# Patient Record
Sex: Female | Born: 1975 | Race: White | Hispanic: No | Marital: Married | Smoking: Never smoker
Health system: Southern US, Community
[De-identification: ages and names within clinical notes are randomized; demographics above are authoritative.]

## PROBLEM LIST (undated history)

## (undated) DIAGNOSIS — Z8041 Family history of malignant neoplasm of ovary: Secondary | ICD-10-CM

## (undated) DIAGNOSIS — K5190 Ulcerative colitis, unspecified, without complications: Secondary | ICD-10-CM

## (undated) DIAGNOSIS — A6920 Lyme disease, unspecified: Secondary | ICD-10-CM

## (undated) HISTORY — DX: Lyme disease, unspecified: A69.20

## (undated) HISTORY — DX: Family history of malignant neoplasm of ovary: Z80.41

---

## 1997-01-22 HISTORY — PX: BREAST CYST ASPIRATION: SHX578

## 2006-08-27 ENCOUNTER — Ambulatory Visit: Payer: Self-pay | Admitting: Internal Medicine

## 2008-05-28 ENCOUNTER — Ambulatory Visit: Payer: Self-pay | Admitting: Otolaryngology

## 2009-09-22 ENCOUNTER — Ambulatory Visit: Payer: Self-pay | Admitting: Obstetrics & Gynecology

## 2011-12-24 ENCOUNTER — Ambulatory Visit: Payer: Self-pay | Admitting: Unknown Physician Specialty

## 2014-03-03 ENCOUNTER — Ambulatory Visit: Payer: Self-pay | Admitting: Physician Assistant

## 2014-05-03 ENCOUNTER — Ambulatory Visit
Admit: 2014-05-03 | Disposition: A | Discharge status: Home / Self Care | Payer: Self-pay | Attending: Family Medicine | Admitting: Family Medicine

## 2014-05-03 LAB — RAPID STREP-A WITH REFLX: Micro Text Report: POSITIVE

## 2015-01-31 ENCOUNTER — Other Ambulatory Visit: Payer: Self-pay | Admitting: Obstetrics & Gynecology

## 2015-01-31 DIAGNOSIS — Z1239 Encounter for other screening for malignant neoplasm of breast: Secondary | ICD-10-CM

## 2015-02-02 ENCOUNTER — Inpatient Hospital Stay
Admission: RE | Admit: 2015-02-02 | Discharge: 2015-02-02 | Disposition: A | Discharge status: Home / Self Care | Payer: Self-pay | Source: Ambulatory Visit | Attending: *Deleted | Admitting: *Deleted

## 2015-02-02 ENCOUNTER — Other Ambulatory Visit: Payer: Self-pay | Admitting: *Deleted

## 2015-02-02 DIAGNOSIS — Z9289 Personal history of other medical treatment: Secondary | ICD-10-CM

## 2015-02-11 ENCOUNTER — Ambulatory Visit
Admission: RE | Admit: 2015-02-11 | Discharge: 2015-02-11 | Disposition: A | Discharge status: Home / Self Care | Payer: BC Managed Care – PPO | Source: Ambulatory Visit | Attending: Obstetrics & Gynecology | Admitting: Obstetrics & Gynecology

## 2015-02-11 ENCOUNTER — Other Ambulatory Visit: Payer: Self-pay | Admitting: Obstetrics & Gynecology

## 2015-02-11 DIAGNOSIS — Z1239 Encounter for other screening for malignant neoplasm of breast: Secondary | ICD-10-CM

## 2015-02-11 DIAGNOSIS — Z1231 Encounter for screening mammogram for malignant neoplasm of breast: Secondary | ICD-10-CM | POA: Insufficient documentation

## 2016-01-09 ENCOUNTER — Ambulatory Visit
Admission: EM | Admit: 2016-01-09 | Discharge: 2016-01-09 | Disposition: A | Discharge status: Home / Self Care | Payer: BC Managed Care – PPO | Attending: Family Medicine | Admitting: Family Medicine

## 2016-01-09 DIAGNOSIS — R6889 Other general symptoms and signs: Secondary | ICD-10-CM | POA: Diagnosis not present

## 2016-01-09 HISTORY — DX: Ulcerative colitis, unspecified, without complications: K51.90

## 2016-01-09 NOTE — Discharge Instructions (Signed)
Rest. Drink plenty of fluids.  ° °Follow up with your primary care physician this week as needed. Return to Urgent care for new or worsening concerns.  ° °

## 2016-01-09 NOTE — ED Triage Notes (Signed)
Patient complains of sinus pain and pressure, headaches. Patient states that she has nasal congestions and coughing. Patient states that symptom started 6 days ago.

## 2016-01-09 NOTE — ED Provider Notes (Signed)
[facility] ____________________________________________  Time seen: Approximately 6:21 PM  I have reviewed the triage vital signs and the nursing notes.   HISTORY  Chief Complaint Facial Pain  HPI  Janet Poole is a 40 y.o. female present for the complaints of 3 days of runny nose, nasal congestion, sinus pressure, body aches and chills. Denies known fevers, but reports has felt like she had a fever at home. States symptoms were sudden onset. Reports multiple sick contacts at work. Patient reports some improvement with over-the-counter congestion medications. Reports overall continues to eat and drink well. Denies sore throat.  Denies recent sickness or recent antibiotic use. Denies chest pain, shortness of breath, abdominal pain, dysuria, extremity pain or extremity swelling.  No LMP recorded. Patient is not currently having periods (Reason: IUD). Denies chance of pregnancy.  Mickey FarberHIES, DAVID, MD: PCP    Past Medical History:  Diagnosis Date  . Ulcerative colitis (HCC)     There are no active problems to display for this patient.   Past Surgical History:  Procedure Laterality Date  . BREAST CYST ASPIRATION  1999    Current Outpatient Rx  . Order #: [id number] Class: Historical Med  . Order #: [id number] Class: Historical Med    No current facility-administered medications for this encounter.   Current Outpatient Prescriptions:  .  mesalamine (LIALDA) 1.2 g EC tablet, Take by mouth daily with breakfast., Disp: , Rfl:  .  PARoxetine (PAXIL) 30 MG tablet, Take 30 mg by mouth daily., Disp: , Rfl:   Allergies Sulfa antibiotics  Family History  Problem Relation Age of Onset  . Breast cancer Paternal Grandmother     Social History Social History  Substance Use Topics  . Smoking status: Never Smoker  . Smokeless tobacco: Never Used  . Alcohol use Yes     Comment: occasionally    Review of Systems Constitutional: As above.   Eyes: No visual  changes. ENT: As above.  Cardiovascular: Denies chest pain. Respiratory: Denies shortness of breath. Gastrointestinal: No abdominal pain.  No nausea, no vomiting.  No diarrhea.  No constipation. Genitourinary: Negative for dysuria. Musculoskeletal: Negative for back pain. Skin: Negative for rash. Neurological: Negative for headaches, focal weakness or numbness.  10-point ROS otherwise negative.  ____________________________________________   PHYSICAL EXAM:  VITAL SIGNS: ED Triage Vitals  Enc Vitals Group     BP 01/09/16 1715 137/86     Pulse Rate 01/09/16 1715 75     Resp 01/09/16 1715 17     Temp 01/09/16 1715 98.4 F (36.9 C)     Temp Source 01/09/16 1715 Oral     SpO2 01/09/16 1715 100 %     Weight 01/09/16 1712 125 lb (56.7 kg)     Height 01/09/16 1712 5' (1.524 m)     Head Circumference --      Peak Flow --      Pain Score 01/09/16 1714 3     Pain Loc --      Pain Edu? --      Excl. in GC? --     Constitutional: Alert and oriented. Well appearing and in no acute distress. Eyes: Conjunctivae are normal. PERRL. EOMI. Head: Atraumatic. No sinus tenderness to palpation. No swelling. No erythema.    Ears: no erythema, normal TMs bilaterally.   Nose: Nasal congestion with clear rhinorrhea.  Mouth/Throat: Mucous membranes are moist. No pharyngeal erythema. No tonsillar swelling or exudate.  Neck: No stridor.  No cervical spine tenderness to palpation. Hematological/Lymphatic/Immunilogical:  No cervical lymphadenopathy. Cardiovascular: Normal rate, regular rhythm. Grossly normal heart sounds.  Good peripheral circulation. Respiratory: Normal respiratory effort.  No retractions. Lungs CTAB. No wheezes, rales or rhonchi. Good air movement.  Gastrointestinal: Soft and nontender. No CVA tenderness. Musculoskeletal: Ambulatory with steady gait. No cervical, thoracic or lumbar tenderness to palpation. Neurologic:  Normal speech and language.  No gait instability. Skin:  Skin  is warm, dry and intact. No rash noted. Psychiatric: Mood and affect are normal. Speech and behavior are normal.  ___________________________________________   LABS (all labs ordered are listed, but only abnormal results are displayed)  Labs Reviewed - No data to display ____________________________________________  PROCEDURES Procedures    INITIAL IMPRESSION / ASSESSMENT AND PLAN / ED COURSE  Pertinent labs & imaging results that were available during my care of the patient were reviewed by me and considered in my medical decision making (see chart for details).  Well-appearing patient. No acute distress. Lungs clear throughout. Suspect influenza-like illness. Discussed evaluation by influenza swab, and patient declines at this time. Declines strep swab, and states no sore throat. Patient states that she has ulcerative colitis and frequently worried about taking other medications, and declines Tamiflu prescription. Patient states that she will take over-the-counter cough and congestion medications that she knows she tolerates well. Work note given for today and tomorrow. Encouraged supportive care, rest, fluids and PCP follow-up as needed.   Discussed follow up with Primary care physician this week. Discussed follow up and return parameters including no resolution or any worsening concerns. Patient verbalized understanding and agreed to plan.   ____________________________________________   FINAL CLINICAL IMPRESSION(S) / ED DIAGNOSES  Final diagnoses:  Flu-like symptoms     Discharge Medication List as of 01/09/2016  6:37 PM      Note: This dictation was prepared with Dragon dictation along with smaller phrase technology. Any transcriptional errors that result from this process are unintentional.    Clinical Course       Renford DillsLindsey Hiran Leard, NP 01/10/16 1927    Renford DillsLindsey Shamarcus Hoheisel, NP 01/10/16 1929

## 2016-02-17 ENCOUNTER — Other Ambulatory Visit: Payer: Self-pay | Admitting: Obstetrics & Gynecology

## 2016-02-17 DIAGNOSIS — Z1231 Encounter for screening mammogram for malignant neoplasm of breast: Secondary | ICD-10-CM

## 2016-03-21 ENCOUNTER — Ambulatory Visit
Admission: RE | Admit: 2016-03-21 | Discharge: 2016-03-21 | Disposition: A | Discharge status: Home / Self Care | Payer: BC Managed Care – PPO | Source: Ambulatory Visit | Attending: Obstetrics & Gynecology | Admitting: Obstetrics & Gynecology

## 2016-03-21 DIAGNOSIS — Z1231 Encounter for screening mammogram for malignant neoplasm of breast: Secondary | ICD-10-CM | POA: Diagnosis not present

## 2016-03-22 ENCOUNTER — Encounter: Payer: Self-pay | Admitting: Obstetrics & Gynecology

## 2017-02-18 ENCOUNTER — Encounter: Payer: Self-pay | Admitting: Obstetrics & Gynecology

## 2017-02-18 ENCOUNTER — Ambulatory Visit (INDEPENDENT_AMBULATORY_CARE_PROVIDER_SITE_OTHER): Payer: BC Managed Care – PPO | Admitting: Obstetrics & Gynecology

## 2017-02-18 VITALS — BP 120/82 | HR 75 | Ht 60.0 in | Wt 125.0 lb

## 2017-02-18 DIAGNOSIS — Z0000 Encounter for general adult medical examination without abnormal findings: Secondary | ICD-10-CM

## 2017-02-18 DIAGNOSIS — Z1231 Encounter for screening mammogram for malignant neoplasm of breast: Secondary | ICD-10-CM

## 2017-02-18 DIAGNOSIS — Z1239 Encounter for other screening for malignant neoplasm of breast: Secondary | ICD-10-CM

## 2017-02-18 DIAGNOSIS — Z01419 Encounter for gynecological examination (general) (routine) without abnormal findings: Secondary | ICD-10-CM | POA: Diagnosis not present

## 2017-02-18 NOTE — Patient Instructions (Signed)
PAP every three years Mammogram every year    Call [phone] to schedule at [facility] every 5 years

## 2017-02-18 NOTE — Progress Notes (Signed)
HPI:      [name] is a 42 y.o. [id number] who LMP was Patient's last menstrual period was 01/18/2017., she presents today for her annual examination. The patient has no complaints today. The patient is sexually active. Her last pap: approximate date 2017 and was normal. The patient does perform self breast exams.  There is no notable family history of breast or ovarian cancer in her family.  The patient has regular exercise: yes.  The patient denies current symptoms of depression.    GYN History: Contraception: vasectomy  PMHx: Past Medical History:  Diagnosis Date  . Lyme disease   . Ulcerative colitis [facility](HCC)    Past Surgical History:  Procedure Laterality Date  . BREAST CYST ASPIRATION  1999   Family History  Problem Relation Age of Onset  . Breast cancer Paternal Grandmother   . Ovarian cancer Maternal Grandmother   . Hypertension Mother   . Hypertension Father   . Stroke Father   . Pancreatic cancer Maternal Aunt    Social History   Tobacco Use  . Smoking status: Never Smoker  . Smokeless tobacco: Never Used  Substance Use Topics  . Alcohol use: Yes    Comment: occasionally  . Drug use: No    Current Outpatient Medications:  .  mesalamine (LIALDA) 1.2 g EC tablet, Take by mouth daily with breakfast., Disp: , Rfl:  .  PARoxetine (PAXIL) 30 MG tablet, Take 30 mg by mouth daily., Disp: , Rfl:  Allergies: Cefdinir; Metronidazole; Sulfa antibiotics; and Sulfasalazine  Review of Systems  Constitutional: Negative for chills, fever and malaise/fatigue.  HENT: Negative for congestion, sinus pain and sore throat.   Eyes: Negative for blurred vision and pain.  Respiratory: Negative for cough and wheezing.   Cardiovascular: Negative for chest pain and leg swelling.  Gastrointestinal: Negative for abdominal pain, constipation, diarrhea, heartburn, nausea and vomiting.  Genitourinary: Negative for dysuria, frequency, hematuria and urgency.  Musculoskeletal:  Negative for back pain, joint pain, myalgias and neck pain.  Skin: Negative for itching and rash.  Neurological: Negative for dizziness, tremors and weakness.  Endo/Heme/Allergies: Does not bruise/bleed easily.  Psychiatric/Behavioral: Negative for depression. The patient is not nervous/anxious and does not have insomnia.     Objective: BP 120/82   Pulse 75   Ht 5' (1.524 m)   Wt 125 lb (56.7 kg)   LMP 01/18/2017   BMI 24.41 kg/m   Filed Weights   02/18/17 1528  Weight: 125 lb (56.7 kg)   Body mass index is 24.41 kg/m. Physical Exam  Constitutional: She is oriented to person, place, and time. She appears well-developed and well-nourished. No distress.  Genitourinary: Rectum normal, vagina normal and uterus normal. Pelvic exam was performed with patient supine. There is no rash or lesion on the right labia. There is no rash or lesion on the left labia. Vagina exhibits no lesion. No bleeding in the vagina. Right adnexum does not display mass and does not display tenderness. Left adnexum does not display mass and does not display tenderness. Cervix does not exhibit motion tenderness, lesion, friability or polyp.   Uterus is mobile and midaxial. Uterus is not enlarged or exhibiting a mass.  HENT:  Head: Normocephalic and atraumatic. Head is without laceration.  Right Ear: Hearing normal.  Left Ear: Hearing normal.  Nose: No epistaxis.  No foreign bodies.  Mouth/Throat: Uvula is midline, oropharynx is clear and moist and mucous membranes are normal.  Eyes: Pupils are equal, round, and  reactive to light.  Neck: Normal range of motion. Neck supple. No thyromegaly present.  Cardiovascular: Normal rate and regular rhythm. Exam reveals no gallop and no friction rub.  No murmur heard. Pulmonary/Chest: Effort normal and breath sounds normal. No respiratory distress. She has no wheezes. Right breast exhibits no mass, no skin change and no tenderness. Left breast exhibits no mass, no skin change  and no tenderness.  Abdominal: Soft. Bowel sounds are normal. She exhibits no distension. There is no tenderness. There is no rebound.  Musculoskeletal: Normal range of motion.  Neurological: She is alert and oriented to person, place, and time. No cranial nerve deficit.  Skin: Skin is warm and dry.  Psychiatric: She has a normal mood and affect. Judgment normal.  Vitals reviewed.  Assessment:  ANNUAL EXAM 1. Annual physical exam   2. Screening for breast cancer    Screening Plan:            1.  Cervical Screening-  Pap smear schedule reviewed with patient  2. Breast screening- Exam annually and mammogram>40 planned   3. Colonoscopy every 10 years, Hemoccult testing - after age 40  4. Labs managed by PCP  5. Counseling for contraception: vasectomy  Uses MIRENA IUD for cycle and mood control, due for exchange this year    F/U  Return in about 5 months (around 07/19/2017) for IUD Appointment (MIRENA), and Annual when due.  Janet Major, MD, [facility], [facility] 02/18/2017  4:06 PM

## 2017-02-19 ENCOUNTER — Encounter: Payer: Self-pay | Admitting: Obstetrics and Gynecology

## 2017-03-25 ENCOUNTER — Ambulatory Visit
Admission: RE | Admit: 2017-03-25 | Discharge: 2017-03-25 | Disposition: A | Discharge status: Home / Self Care | Payer: BC Managed Care – PPO | Source: Ambulatory Visit | Attending: Obstetrics & Gynecology | Admitting: Obstetrics & Gynecology

## 2017-03-25 DIAGNOSIS — Z1231 Encounter for screening mammogram for malignant neoplasm of breast: Secondary | ICD-10-CM | POA: Insufficient documentation

## 2017-03-25 DIAGNOSIS — Z1239 Encounter for other screening for malignant neoplasm of breast: Secondary | ICD-10-CM

## 2017-03-26 ENCOUNTER — Encounter: Payer: Self-pay | Admitting: Obstetrics & Gynecology

## 2018-02-07 ENCOUNTER — Other Ambulatory Visit: Payer: Self-pay | Admitting: Otolaryngology

## 2018-02-07 DIAGNOSIS — R490 Dysphonia: Secondary | ICD-10-CM

## 2018-02-11 ENCOUNTER — Ambulatory Visit
Admission: RE | Admit: 2018-02-11 | Discharge: 2018-02-11 | Disposition: A | Discharge status: Home / Self Care | Payer: BC Managed Care – PPO | Source: Ambulatory Visit | Attending: Otolaryngology | Admitting: Otolaryngology

## 2018-02-11 ENCOUNTER — Encounter (INDEPENDENT_AMBULATORY_CARE_PROVIDER_SITE_OTHER): Payer: Self-pay

## 2018-02-11 DIAGNOSIS — R490 Dysphonia: Secondary | ICD-10-CM | POA: Diagnosis not present

## 2018-02-11 MED ORDER — IOHEXOL 300 MG/ML  SOLN
75.0000 mL | Freq: Once | INTRAMUSCULAR | Status: AC | PRN
  Administered 2018-02-11: 75 mL via INTRAVENOUS

## 2018-02-17 ENCOUNTER — Ambulatory Visit: Payer: BC Managed Care – PPO | Attending: Otolaryngology | Admitting: Speech Pathology

## 2018-02-17 DIAGNOSIS — R490 Dysphonia: Secondary | ICD-10-CM | POA: Diagnosis not present

## 2018-02-18 ENCOUNTER — Other Ambulatory Visit: Payer: Self-pay

## 2018-02-18 ENCOUNTER — Encounter: Payer: Self-pay | Admitting: Speech Pathology

## 2018-02-18 NOTE — Therapy (Signed)
[place] [facility] MAIN [facility] SERVICES [address] [place], [place], [address] Phone: [phone]   Fax:  [phone]  Speech Language Pathology Evaluation  Patient Details  Name: Keya Jezewski MRN: [id number] Date of Birth: [birth year] Referring Provider (SLP): Dr. Elenore Rota   Encounter Date: 02/17/2018  End of Session - 02/18/18 1528    Visit Number  1    Number of Visits  9    Date for SLP Re-Evaluation  03/21/18       Past Medical History:  Diagnosis Date   Family history of ovarian cancer    cancer genetic testing letter sent 1/18   Lyme disease    Ulcerative colitis [facility])     Past Surgical History:  Procedure Laterality Date   BREAST CYST ASPIRATION  1999    There were no vitals filed for this visit.      SLP Evaluation [facility] - 02/18/18 0001      SLP Visit Information   SLP Received On  02/17/18    Referring Provider (SLP)  Dr. 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      Prior Functional Status   Cognitive/Linguistic Baseline  Within functional limits      Oral Motor/Sensory Function   Overall Oral Motor/Sensory Function  Appears within functional limits for tasks assessed      Motor Speech   Overall Motor Speech  Impaired    Respiration  Impaired    Level of Impairment  Conversation    Phonation  Aphonic;Breathy;Hoarse    Resonance  Within functional limits    Articulation  Within functional limitis    Intelligibility  Intelligible    Motor Planning  Witnin functional limits    Phonation  Impaired    Vocal Abuses  Habitual Hyperphonia;Habitual Cough/Throat Clear;Prolonged Vocal Use    Tension Present  Jaw;Neck;Shoulder    Volume  Soft    Pitch  Low       Standardized Assessments   Standardized Assessments   Other Assessment   Perceptual Voice Evaluation      Perceptual Voice Evaluation Voice checklist:  Health risks: GERD, seasonal allergies   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)  Environmental risks: stress at work   Misuse: 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), speaking without good breath support  Abuse: throat clearing, animated reading to children  Vocal characteristics: breathy, strained/aphonic vocal quality, limited pitch range, poor vocal projection, excessive pharyngeal resonance Maximum phonation time for sustained ah: 6 seconds Average fundamental frequency during sustained ah: 228 Hz (within 1 STD for gender) Habitual pitch: 202 Hz  Highest dynamic pitch when altering pitch from a low note to a high note: 446 Hz Lowest dynamic pitch when altering from a high note to a low note: 175 Hz Average time patient was able to sustain /s/: 14.3 seconds Average time patient was able to sustain /z/: 6 seconds s/z ratio: 2.4 Visi-Pitch: Multi-Dimensional Voice Program (MDVP)  MDVP extracts objective quantitative values (Relative Average Perturbation, Shimmer, Voice Turbulence Index, and Noise to Harmonic Ratio) on sustained phonation, which are displayed graphically and numerically in comparison to a built-in normative  database.  The patient exhibited values outside the norm for Relative Average Perturbation, Shimmer, and Voice Turbulence Index.  Average fundamental frequency was within 1 STD average for age and gender.  Patient able to improve all parameters with model (Loud like me) Education: Patient instructed in extrinsic laryngeal muscle stretches, breath support exercises, and trills  SLP Education - 02/18/18 1527    Education Details  Voice therapy    Person(s) Educated  Patient    Methods  Explanation    Comprehension  Verbalized understanding          SLP Long Term Goals - 02/18/18 1529      SLP LONG TERM GOAL #1   Title  The patient will demonstrate independent understanding of vocal hygiene concepts and extrinsic laryngeal muscle stretches.      Time  4    Period  Weeks    Status  New    Target Date  03/21/18      SLP LONG TERM GOAL #2   Title  The patient will be independent for abdominal breathing and breath support exercises.    Time  4    Period  Weeks    Status  New    Target Date  03/21/18      SLP LONG TERM GOAL #3   Title  The patient will minimize vocal tension via resonant voice therapy (or comparable technique) with min SLP cues with 80% accuracy.    Time  4    Period  Weeks    Status  New    Target Date  03/21/18      SLP LONG TERM GOAL #4   Title  The patient will maximize voice quality and loudness using breath support/oral resonance for paragraph length recitation with 80% accuracy.    Time  4    Period  Weeks    Status  New    Target Date  03/21/18       Plan - 02/18/18 1528    Clinical Impression Statement  This 43 year old woman under the care of Dr. Elenore Rota, with vocal nodules, is presenting with moderate dysphonia.  The patient demonstrates hoarse and breathy vocal quality, reduced breath control for speech, excessive pharyngeal resonance, strained/tense phonation, limited pitch range, vocal fatigue, and laryngeal tension. She will benefit from voice therapy for education, to improve breath support, improve tone focus, promote easy flow phonation, and learn techniques to increase loudness and pitch range without strain.    Speech Therapy Frequency  2x / week    Duration  4 weeks    Treatment/Interventions  SLP instruction and feedback;Patient/family education   Voice therapy   Potential to Achieve Goals  Good    Potential Considerations  Ability to learn/carryover information;Pain level;Family/community support;Co-morbidities;Previous level of function;Cooperation/participation level;Severity  of impairments;Medical prognosis    SLP Home Exercise Plan  Provided    Consulted and Agree with Plan of Care  Patient       Patient will benefit from skilled therapeutic intervention in order to improve the following deficits and impairments:   Dysphonia - Plan: SLP plan of care cert/re-cert    Problem List There are no active problems to display for this patient.  Dollene Primrose, MS/CCC- SLP  Leandrew Koyanagi 02/18/2018, 3:34 PM  [place] [facility] MAIN [facility] SERVICES [address] [place], [place], [address] Phone: [phone]   Fax:  [phone]  Name: Emelynn Jourdan MRN: [id number] Date of Birth: January 20, 1976

## 2018-02-20 ENCOUNTER — Ambulatory Visit: Payer: BC Managed Care – PPO | Admitting: Speech Pathology

## 2018-02-24 ENCOUNTER — Ambulatory Visit: Payer: BC Managed Care – PPO | Attending: Otolaryngology | Admitting: Speech Pathology

## 2018-02-24 DIAGNOSIS — R490 Dysphonia: Secondary | ICD-10-CM | POA: Diagnosis not present

## 2018-02-25 ENCOUNTER — Ambulatory Visit: Payer: BC Managed Care – PPO | Admitting: Speech Pathology

## 2018-02-25 ENCOUNTER — Encounter: Payer: Self-pay | Admitting: Speech Pathology

## 2018-02-25 NOTE — Therapy (Signed)
[place] [facility] MAIN [facility] SERVICES [address] [place], [place], [address] Phone: [phone]   Fax:  [phone]  Speech Language Pathology Treatment  Patient Details  Name: Janet Poole MRN: [id number] Date of Birth: [birth year] Referring Provider (SLP): Dr. Elenore Rota   Encounter Date: 02/24/2018  End of Session - 02/25/18 1125    Visit Number  2    Date for SLP Re-Evaluation  03/21/18    SLP Start Time  1600    SLP Stop Time   1655    SLP Time Calculation (min)  55 min    Activity Tolerance  Patient tolerated treatment well       Past Medical History:  Diagnosis Date  . Family history of ovarian cancer    cancer genetic testing letter sent 1/18  . Lyme disease   . Ulcerative colitis [facility])     Past Surgical History:  Procedure Laterality Date  . BREAST CYST ASPIRATION  1999    There were no vitals filed for this visit.  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  The patient was provided with written and verbal teaching regarding neck, shoulder, tongue, and throat stretches exercises to promote relaxed phonation. The patient was provided with written and verbal teaching for supplemental vocal tract relaxation exercises (tongue and lip trills).  The patient was provided with written and verbal teaching regarding breath support  exercises.  The patient was provided with verbal, written and recorded instruction in resonant voice exercises:  Hum- Sustained.  The patient is having significant difficulty releasing laryngeal tension.  Her best voice is with voiced lip trill.  She can inconsistently transition to a hum with clear vocal quality.  The patient is able to hear clear vocal quality and inconsistently identify how she achieves the better quality.        Assessment / Recommendations / Plan   Plan  Continue with current plan of care      Progression Toward Goals   Progression toward goals  Progressing toward goals       SLP Education - 02/25/18 1125    Education Details  resonant voice    Person(s) Educated  Patient    Methods  Explanation    Comprehension  Verbalized understanding;Need further instruction         SLP Long Term Goals - 02/18/18 1529      SLP LONG TERM GOAL #1   Title  The patient will demonstrate independent understanding of vocal hygiene concepts and extrinsic laryngeal muscle stretches.      Time  4    Period  Weeks    Status  New    Target Date  03/21/18      SLP LONG TERM GOAL #2   Title  The patient will  be independent for abdominal breathing and breath support exercises.    Time  4    Period  Weeks    Status  New    Target Date  03/21/18      SLP LONG TERM GOAL #3   Title  The patient will minimize vocal tension via resonant voice therapy (or comparable technique) with min SLP cues with 80% accuracy.    Time  4    Period  Weeks    Status  New    Target Date  03/21/18      SLP LONG TERM GOAL #4   Title  The patient will maximize voice quality and loudness using breath support/oral resonance for paragraph length recitation with 80% accuracy.    Time  4    Period  Weeks    Status  New    Target Date  03/21/18       Plan - 02/25/18 1125    Clinical Impression Statement  The patient can inconsistently generate a clear, oral resonant voice.  She is provided with  recommended strategies to learn and generalize oral resonant production on sustained nasal sounds (m/n/ng).    Speech Therapy Frequency  2x / week    Duration  4 weeks    Treatment/Interventions  SLP instruction and feedback;Patient/family education   Voice therapy   Potential to Achieve Goals  Good    Potential Considerations  Ability to learn/carryover information;Pain level;Family/community support;Co-morbidities;Previous level of function;Cooperation/participation level;Severity of impairments;Medical prognosis    SLP Home Exercise Plan  Provided    Consulted and Agree with Plan of Care  Patient       Patient will benefit from skilled therapeutic intervention in order to improve the following deficits and impairments:   Dysphonia    Problem List There are no active problems to display for this patient.    Leandrew Koyanagibernathy, [name] 02/25/2018, 11:26 AM  [place] [facility] MAIN [facility] SERVICES [address] [place], [place], [address] Phone: [phone]   Fax:  [phone]   Name: Janet Poole MRN: [id number] Date of Birth: November 04, 1975

## 2018-02-27 ENCOUNTER — Ambulatory Visit: Payer: BC Managed Care – PPO | Admitting: Speech Pathology

## 2018-03-03 ENCOUNTER — Ambulatory Visit: Payer: BC Managed Care – PPO | Admitting: Speech Pathology

## 2018-03-03 DIAGNOSIS — R490 Dysphonia: Secondary | ICD-10-CM | POA: Diagnosis not present

## 2018-03-04 ENCOUNTER — Encounter: Payer: Self-pay | Admitting: Speech Pathology

## 2018-03-04 NOTE — Therapy (Signed)
[place] [facility] MAIN [facility] SERVICES [address] [place], [place], [address] Phone: [phone]   Fax:  [phone]  Speech Language Pathology Treatment  Patient Details  Name: Janet Poole Janet Poole MRN: [id number] Date of Birth: 02-21-1975 Referring Provider (SLP): Dr. Elenore RotaJuengel   Encounter Date: 03/03/2018  End of Session - 03/04/18 1145    Visit Number  3    Number of Visits  9    Date for SLP Re-Evaluation  03/21/18    SLP Start Time  1600    SLP Stop Time   1655    SLP Time Calculation (min)  55 min    Activity Tolerance  Patient tolerated treatment well       Past Medical History:  Diagnosis Date  . Family history of ovarian cancer    cancer genetic testing letter sent 1/18  . Lyme disease   . Ulcerative colitis [facility](HCC)     Past Surgical History:  Procedure Laterality Date  . BREAST CYST ASPIRATION  1999    There were no vitals filed for this visit.  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  The patient was provided with written and verbal teaching regarding neck, shoulder, tongue, and throat stretches exercises to promote relaxed phonation. The patient was provided with written and verbal teaching for supplemental vocal tract relaxation exercises (tongue and lip trills).  The patient was provided with written and verbal teaching regarding breath support exercises.  The patient was  provided with verbal, written and recorded instruction in resonant voice exercises:  Hum- Sustained.  The patient is having significant difficulty releasing laryngeal tension.  Her best voice is with voiced lip trill.  She can inconsistently transition to a hum with clear vocal quality.  The patient had some success with tongue-out hum, easy onset, and semi-occluded vocal tract.  The patient is able to hear clear vocal quality and inconsistently identify how she achieves the better quality.        Assessment / Recommendations / Plan   Plan  Continue with current plan of care      Progression Toward Goals   Progression toward goals  Progressing toward goals       SLP Education - 03/04/18 1144    Education Details  semi-occluded vocal tract, easy onset    Person(s) Educated  Patient    Comprehension  Verbalized understanding         SLP Long Term Goals - 02/18/18 1529      SLP LONG TERM GOAL #1   Title  The patient will demonstrate independent understanding of vocal hygiene concepts and extrinsic laryngeal muscle stretches.      Time  4    Period  Weeks    Status  New    Target Date  03/21/18      SLP LONG  TERM GOAL #2   Title  The patient will be independent for abdominal breathing and breath support exercises.    Time  4    Period  Weeks    Status  New    Target Date  03/21/18      SLP LONG TERM GOAL #3   Title  The patient will minimize vocal tension via resonant voice therapy (or comparable technique) with min SLP cues with 80% accuracy.    Time  4    Period  Weeks    Status  New    Target Date  03/21/18      SLP LONG TERM GOAL #4   Title  The patient will maximize voice quality and loudness using breath support/oral resonance for paragraph length recitation with 80% accuracy.    Time  4    Period  Weeks    Status  New    Target Date  03/21/18       Plan - 03/04/18 1145    Clinical Impression Statement  The patient can inconsistently generate a clear, oral  resonant voice.  She is provided with recommended strategies to learn and generalize oral resonant production on sustained nasal sounds (m/n/ng).    Speech Therapy Frequency  2x / week    Duration  4 weeks    Treatment/Interventions  SLP instruction and feedback;Patient/family education   Voice therapy   Potential to Achieve Goals  Good    Potential Considerations  Ability to learn/carryover information;Pain level;Family/community support;Co-morbidities;Previous level of function;Cooperation/participation level;Severity of impairments;Medical prognosis    SLP Home Exercise Plan  Provided    Consulted and Agree with Plan of Care  Patient       Patient will benefit from skilled therapeutic intervention in order to improve the following deficits and impairments:   Dysphonia    Problem List There are no active problems to display for this patient.  Dollene Primrose, MS/CCC- SLP  Leandrew Koyanagi 03/04/2018, 11:46 AM   [facility] MAIN [facility] SERVICES [address] [place], [place], [address] Phone: [phone]   Fax:  [phone]   Name: Janet Poole MRN: [id number] Date of Birth: 08-14-1975

## 2018-03-07 ENCOUNTER — Ambulatory Visit: Payer: BC Managed Care – PPO | Admitting: Speech Pathology

## 2018-03-10 ENCOUNTER — Ambulatory Visit: Payer: BC Managed Care – PPO | Admitting: Speech Pathology

## 2018-03-10 DIAGNOSIS — R490 Dysphonia: Secondary | ICD-10-CM

## 2018-03-11 ENCOUNTER — Encounter: Payer: Self-pay | Admitting: Speech Pathology

## 2018-03-11 NOTE — Therapy (Signed)
[place] [facility] MAIN [facility] SERVICES [address] [place], [place], [address] Phone: [phone]   Fax:  [phone]  Speech Language Pathology Treatment  Patient Details  Name: Janet Poole MRN: [id number] Date of Birth: June 25, 1975 Referring Provider (SLP): Dr. Elenore Rota   Encounter Date: 03/10/2018  End of Session - 03/11/18 1150    Visit Number  4    Number of Visits  9    Date for SLP Re-Evaluation  03/21/18    SLP Start Time  1500    SLP Stop Time   1550    SLP Time Calculation (min)  50 min    Activity Tolerance  Patient tolerated treatment well       Past Medical History:  Diagnosis Date  . Family history of ovarian cancer    cancer genetic testing letter sent 1/18  . Lyme disease   . Ulcerative colitis [facility])     Past Surgical History:  Procedure Laterality Date  . BREAST CYST ASPIRATION  1999    There were no vitals filed for this visit.  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  The patient was provided with written and verbal teaching regarding neck, shoulder, tongue, and throat stretches exercises to promote relaxed phonation. The patient was provided with written and verbal teaching for supplemental vocal tract relaxation exercises (tongue and lip trills).  The patient was provided with written and verbal teaching regarding breath support exercises.  The  patient was provided with verbal, written and recorded instruction in resonant voice exercises:  Hum- Sustained.  The patient is having significant difficulty releasing laryngeal tension.  Her best voice is with voiced lip trill.  She can inconsistently transition to a hum with clear vocal quality.  The patient had improved success with semi-occluded vocal tract.  The patient was able to improve vocal quality for initial /w/ words, reading sentences, and rhyming words with long vowels.  Slowing rate is helpful.       Assessment / Recommendations / Plan   Plan  Continue with current plan of care      Progression Toward Goals   Progression toward goals  Progressing toward goals       SLP Education - 03/11/18 1149    Education Details  semi-occluded vocal tract    Person(s) Educated  Patient    Methods  Explanation    Comprehension  Verbalized understanding         SLP Long Term Goals - 02/18/18 1529      SLP LONG TERM GOAL #1   Title  The patient will demonstrate independent understanding of vocal hygiene concepts and extrinsic laryngeal muscle stretches.      Time  4    Period  Weeks    Status  New    Target Date  03/21/18      SLP LONG TERM GOAL #2   Title  The patient will be independent for abdominal breathing and breath support exercises.    Time  4    Period  Weeks    Status  New    Target Date  03/21/18      SLP LONG TERM GOAL #3   Title  The patient will minimize vocal tension via resonant voice therapy (or comparable technique) with min SLP cues with 80% accuracy.    Time  4    Period  Weeks    Status  New    Target Date  03/21/18      SLP LONG TERM GOAL #4   Title  The patient will maximize voice quality and loudness using breath support/oral resonance for paragraph length recitation with 80% accuracy.    Time  4    Period  Weeks    Status  New    Target Date  03/21/18       Plan - 03/11/18 1150    Clinical Impression Statement  The patient can  inconsistently generate a clear, oral resonant voice.  She is provided with recommended strategies to learn and generalize oral resonant production with rhyming words.    Speech Therapy Frequency  2x / week    Duration  4 weeks    Treatment/Interventions  SLP instruction and feedback;Patient/family education   Voice therapy   Potential to Achieve Goals  Good    Potential Considerations  Ability to learn/carryover information;Pain level;Family/community support;Co-morbidities;Previous level of function;Cooperation/participation level;Severity of impairments;Medical prognosis    SLP Home Exercise Plan  Provided    Consulted and Agree with Plan of Care  Patient       Patient will benefit from skilled therapeutic intervention in order to improve the following deficits and impairments:   Dysphonia    Problem List There are no active problems to display for this patient.  Dollene Primrose, MS/CCC- SLP  Leandrew Koyanagi 03/11/2018, 11:51 AM  [place] [facility] MAIN [facility] SERVICES [address] [place], [place], [address] Phone: [phone]   Fax:  [phone]   Name: Janet Poole MRN: [id number] Date of Birth: 16-Apr-1975

## 2018-03-12 ENCOUNTER — Ambulatory Visit: Payer: BC Managed Care – PPO | Admitting: Speech Pathology

## 2018-03-13 ENCOUNTER — Ambulatory Visit: Payer: BC Managed Care – PPO | Admitting: Speech Pathology

## 2018-03-13 ENCOUNTER — Encounter: Payer: Self-pay | Admitting: Speech Pathology

## 2018-03-13 DIAGNOSIS — R490 Dysphonia: Secondary | ICD-10-CM

## 2018-03-13 NOTE — Therapy (Signed)
[place] [facility] MAIN [facility] SERVICES [address] [place], [place], [address] Phone: [phone]   Fax:  [phone]  Speech Language Pathology Treatment  Patient Details  Name: Janet Poole MRN: [id number] Date of Birth: [birth year] Referring Provider (SLP): Dr. Elenore Rota   Encounter Date: 03/13/2018  End of Session - 03/13/18 1558    Visit Number  5    Number of Visits  9    Date for SLP Re-Evaluation  03/21/18    SLP Start Time  1500    SLP Stop Time   1550    SLP Time Calculation (min)  50 min    Activity Tolerance  Patient tolerated treatment well       Past Medical History:  Diagnosis Date  . Family history of ovarian cancer    cancer genetic testing letter sent 1/18  . Lyme disease   . Ulcerative colitis [facility])     Past Surgical History:  Procedure Laterality Date  . BREAST CYST ASPIRATION  1999    There were no vitals filed for this visit.  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  The patient was provided with written and verbal teaching regarding neck, shoulder, tongue, and throat stretches exercises to promote relaxed phonation. The patient was provided with written and verbal teaching for supplemental vocal tract relaxation exercises (tongue and lip trills).  The patient was provided with written and verbal teaching regarding breath support exercises.  The  patient was provided with verbal, written and recorded instruction in resonant voice exercises:  Hum- Sustained.  The patient is having significant difficulty releasing laryngeal tension.  Her best voice is with voiced lip trill.  She can inconsistently transition to a hum with clear vocal quality.  The patient had improved success with semi-occluded vocal tract.  The patient was able to improve vocal quality for initial /m/ words, reading phrases, reading nursery rhymes, and rhyming words with long vowels.  Slowing rate is helpful.       Assessment / Recommendations / Plan   Plan  Continue with current plan of care      Progression Toward Goals   Progression toward goals  Progressing toward goals       SLP Education - 03/13/18 1558    Education Details  semi-occluded vocal tract    Person(s) Educated  Patient    Methods  Explanation    Comprehension  Verbalized understanding         SLP Long Term Goals - 02/18/18 1529      SLP LONG TERM GOAL #1   Title  The patient will demonstrate independent understanding of vocal hygiene concepts and extrinsic laryngeal muscle stretches.      Time  4    Period  Weeks    Status  New  Target Date  03/21/18      SLP LONG TERM GOAL #2   Title  The patient will be independent for abdominal breathing and breath support exercises.    Time  4    Period  Weeks    Status  New    Target Date  03/21/18      SLP LONG TERM GOAL #3   Title  The patient will minimize vocal tension via resonant voice therapy (or comparable technique) with min SLP cues with 80% accuracy.    Time  4    Period  Weeks    Status  New    Target Date  03/21/18      SLP LONG TERM GOAL #4   Title  The patient will maximize voice quality and loudness using breath support/oral resonance for paragraph length recitation with 80% accuracy.    Time  4    Period  Weeks    Status  New    Target Date  03/21/18       Plan - 03/13/18 1559    Clinical Impression Statement  The  patient can inconsistently generate a clear, oral resonant voice.  She is provided with recommended strategies to learn and generalize oral resonant production with rhyming words.    Speech Therapy Frequency  2x / week    Duration  4 weeks    Treatment/Interventions  SLP instruction and feedback;Patient/family education   Voice therapy   Potential to Achieve Goals  Good    Potential Considerations  Ability to learn/carryover information;Pain level;Family/community support;Co-morbidities;Previous level of function;Cooperation/participation level;Severity of impairments;Medical prognosis    SLP Home Exercise Plan  Provided    Consulted and Agree with Plan of Care  Patient       Patient will benefit from skilled therapeutic intervention in order to improve the following deficits and impairments:   Dysphonia    Problem List There are no active problems to display for this patient.  Dollene Primrose, MS/CCC- SLP  Leandrew Koyanagi 03/13/2018, 3:59 PM  [place] [facility] MAIN [facility] SERVICES [address] [place], [place], [address] Phone: [phone]   Fax:  [phone]   Name: Janet Poole MRN: [id number] Date of Birth: March 02, 1975

## 2018-03-14 ENCOUNTER — Ambulatory Visit: Payer: BC Managed Care – PPO | Admitting: Speech Pathology

## 2018-03-17 ENCOUNTER — Ambulatory Visit: Payer: BC Managed Care – PPO | Admitting: Speech Pathology

## 2018-03-17 DIAGNOSIS — R490 Dysphonia: Secondary | ICD-10-CM | POA: Diagnosis not present

## 2018-03-18 ENCOUNTER — Encounter: Payer: Self-pay | Admitting: Speech Pathology

## 2018-03-18 NOTE — Therapy (Signed)
[place] [facility] MAIN [facility] SERVICES [address] [place], [place], [address] Phone: [phone]   Fax:  [phone]  Speech Language Pathology Treatment  Patient Details  Name: Janet Poole MRN: [id number] Date of Birth: November 30, 1975 Referring Provider (SLP): Dr. Elenore Rota   Encounter Date: 03/17/2018  End of Session - 03/18/18 1227    Visit Number  6    Number of Visits  9    Date for SLP Re-Evaluation  03/21/18    SLP Start Time  1500    SLP Stop Time   1550    SLP Time Calculation (min)  50 min    Activity Tolerance  Patient tolerated treatment well       Past Medical History:  Diagnosis Date  . Family history of ovarian cancer    cancer genetic testing letter sent 1/18  . Lyme disease   . Ulcerative colitis [facility])     Past Surgical History:  Procedure Laterality Date  . BREAST CYST ASPIRATION  1999    There were no vitals filed for this visit.  Subjective Assessment - 03/18/18 1226    Subjective  The patient reports that her voice is lasting longer for teaching.            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  The patient was provided with written and verbal teaching regarding neck, shoulder, tongue, and throat stretches exercises to promote relaxed phonation. The patient was provided with written and verbal teaching for supplemental vocal tract relaxation exercises (tongue and lip trills).  The patient was provided with written and verbal teaching regarding  breath support exercises.  The patient is now able to sustain /s/ for up to 30 seconds, indicating improved breath control.  The patient was provided with verbal, written and recorded instruction in resonant voice exercises:  Hum- Sustained.  The patient continues to have significant difficulty releasing laryngeal tension.  Her best voice is with voiced lip trill.  She can inconsistently transition to a hum with clear vocal quality.  The patient had improved success with semi-occluded vocal tract.  The patient was able to improve vocal quality for initial /m/ words, reading phrases, reading nursery rhymes, and rhyming words with long vowels.  Slowing rate is helpful. The patient will practice more unvoiced breath support/control activities.      Assessment / Recommendations / Plan   Plan  Continue with current plan of care      Progression Toward Goals   Progression toward goals  Progressing toward goals       SLP Education - 03/18/18 1226    Education Details  breath support/control activities    Person(s) Educated  Patient    Methods  Explanation    Comprehension  Verbalized understanding         SLP Long Term Goals - 02/18/18 1529      SLP LONG TERM GOAL #1   Title  The patient will demonstrate independent  understanding of vocal hygiene concepts and extrinsic laryngeal muscle stretches.      Time  4    Period  Weeks    Status  New    Target Date  03/21/18      SLP LONG TERM GOAL #2   Title  The patient will be independent for abdominal breathing and breath support exercises.    Time  4    Period  Weeks    Status  New    Target Date  03/21/18      SLP LONG TERM GOAL #3   Title  The patient will minimize vocal tension via resonant voice therapy (or comparable technique) with min SLP cues with 80% accuracy.    Time  4    Period  Weeks    Status  New    Target Date  03/21/18      SLP LONG TERM GOAL #4   Title  The patient will maximize voice quality and loudness using  breath support/oral resonance for paragraph length recitation with 80% accuracy.    Time  4    Period  Weeks    Status  New    Target Date  03/21/18       Plan - 03/18/18 1228    Clinical Impression Statement  The patient can inconsistently generate a clear, oral resonant voice.  She is provided with recommended strategies to learn and generalize oral resonant production with rhyming words.  She demonstrates improved breath control for unvoiced sounds.    Speech Therapy Frequency  2x / week    Duration  4 weeks    Treatment/Interventions  SLP instruction and feedback;Patient/family education   Voice therapy   Potential to Achieve Goals  Good    Potential Considerations  Ability to learn/carryover information;Pain level;Family/community support;Co-morbidities;Previous level of function;Cooperation/participation level;Severity of impairments;Medical prognosis    SLP Home Exercise Plan  Provided    Consulted and Agree with Plan of Care  Patient       Patient will benefit from skilled therapeutic intervention in order to improve the following deficits and impairments:   Dysphonia    Problem List There are no active problems to display for this patient.  Dollene Primrose, MS/CCC- SLP  Leandrew Koyanagi 03/18/2018, 12:29 PM  [place] [facility] MAIN [facility] SERVICES [address] [place], [place], [address] Phone: [phone]   Fax:  [phone]   Name: Janet Poole MRN: [id number] Date of Birth: October 12, 1975

## 2018-03-19 ENCOUNTER — Ambulatory Visit: Payer: BC Managed Care – PPO | Admitting: Speech Pathology

## 2018-03-19 DIAGNOSIS — R490 Dysphonia: Secondary | ICD-10-CM | POA: Diagnosis not present

## 2018-03-20 ENCOUNTER — Encounter: Payer: Self-pay | Admitting: Speech Pathology

## 2018-03-20 NOTE — Therapy (Signed)
[place] [facility] MAIN [facility] SERVICES [address] [place], [place], [address] Phone: [phone]   Fax:  [phone]  Speech Language Pathology Treatment  Patient Details  Name: Janet Poole MRN: [id number] Date of Birth: 06-09-1975 Referring Provider (SLP): Dr. Elenore Rota   Encounter Date: 03/19/2018  End of Session - 03/20/18 1454    Visit Number  7    Number of Visits  9    Date for SLP Re-Evaluation  03/21/18    SLP Start Time  1500    SLP Stop Time   1550    SLP Time Calculation (min)  50 min    Activity Tolerance  Patient tolerated treatment well       Past Medical History:  Diagnosis Date  . Family history of ovarian cancer    cancer genetic testing letter sent 1/18  . Lyme disease   . Ulcerative colitis [facility])     Past Surgical History:  Procedure Laterality Date  . BREAST CYST ASPIRATION  1999    There were no vitals filed for this visit.  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  The patient was provided with written and verbal teaching regarding neck, shoulder, tongue, and throat stretches exercises to promote relaxed phonation. The patient was provided with written and verbal teaching for supplemental vocal tract relaxation exercises (tongue and lip trills).  The patient was provided with written and verbal teaching regarding  breath support exercises.  The patient is now able to sustain /s/ for up to 30 seconds, indicating improved breath control.  The patient was provided with verbal, written and recorded instruction in resonant voice exercises:  Hum- Sustained.  The patient continues to have significant difficulty releasing laryngeal tension.  Her best voice is with voiced lip trill.  She can inconsistently transition to a hum with clear vocal quality.  The patient had improved success with semi-occluded vocal tract.  The patient was able to improve vocal quality for initial /m/ words, reading phrases, reading nursery rhymes, and rhyming words with long vowels.  Slowing rate is helpful. The patient will practice more unvoiced breath support/control activities.  The patient has much improved vocal quality overall, essentially no hoarseness or aphonic periods.  However, she continues to have muffled/excessive pharyngeal focus.  She demonstrates some improvement with constant flow phonation to encourage more oral resonance.      Assessment / Recommendations / Plan   Plan  Continue with current plan of care      Progression Toward Goals   Progression toward goals  Progressing toward goals       SLP Education - 03/20/18 1454    Education Details  constant flow phonation    Person(s) Educated  Patient    Methods  Explanation  Comprehension  Verbalized understanding;Need further instruction         SLP Long Term Goals - 02/18/18 1529      SLP LONG TERM GOAL #1   Title  The patient will demonstrate independent understanding of vocal hygiene concepts and extrinsic laryngeal muscle stretches.      Time  4    Period  Weeks    Status  New    Target Date  03/21/18      SLP LONG TERM GOAL #2   Title  The patient will be independent for abdominal breathing and breath support exercises.    Time  4    Period  Weeks    Status  New    Target Date  03/21/18      SLP LONG TERM GOAL #3   Title  The patient will  minimize vocal tension via resonant voice therapy (or comparable technique) with min SLP cues with 80% accuracy.    Time  4    Period  Weeks    Status  New    Target Date  03/21/18      SLP LONG TERM GOAL #4   Title  The patient will maximize voice quality and loudness using breath support/oral resonance for paragraph length recitation with 80% accuracy.    Time  4    Period  Weeks    Status  New    Target Date  03/21/18       Plan - 03/20/18 1455    Clinical Impression Statement  The patient can inconsistently generate a clear, oral resonant voice.  She is provided with recommended strategies to learn and generalize oral resonant production with rhyming words.  She demonstrates improved breath control for unvoiced sounds.  The patient reports improved vocal quality, improved vocal endurance, and no pain associated with talking.    Speech Therapy Frequency  2x / week    Duration  4 weeks    Treatment/Interventions  SLP instruction and feedback;Patient/family education   Voice therapy   Potential to Achieve Goals  Good    Potential Considerations  Ability to learn/carryover information;Pain level;Family/community support;Co-morbidities;Previous level of function;Cooperation/participation level;Severity of impairments;Medical prognosis    SLP Home Exercise Plan  Provided    Consulted and Agree with Plan of Care  Patient       Patient will benefit from skilled therapeutic intervention in order to improve the following deficits and impairments:   Dysphonia    Problem List There are no active problems to display for this patient.  Dollene Primrose, MS/CCC- SLP  Leandrew Koyanagi 03/20/2018, 2:58 PM  [place] [facility] MAIN [facility] SERVICES [address] [place], [place], [address] Phone: [phone]   Fax:  [phone]   Name: Aarvi Stables MRN: [id number] Date of Birth: 09-15-1975

## 2018-03-21 ENCOUNTER — Ambulatory Visit: Payer: BC Managed Care – PPO | Admitting: Speech Pathology

## 2018-03-25 ENCOUNTER — Ambulatory Visit: Payer: BC Managed Care – PPO | Admitting: Speech Pathology

## 2018-03-25 ENCOUNTER — Ambulatory Visit: Payer: BC Managed Care – PPO | Attending: Otolaryngology | Admitting: Speech Pathology

## 2018-03-25 DIAGNOSIS — R490 Dysphonia: Secondary | ICD-10-CM | POA: Diagnosis not present

## 2018-03-26 ENCOUNTER — Encounter: Payer: Self-pay | Admitting: Speech Pathology

## 2018-03-26 NOTE — Therapy (Signed)
[place] MAIN [facility] SERVICES [address] [place], [place], [address] Phone: [phone]   Fax:  [phone]  Speech Language Pathology Treatment/Re-Certification  Patient Details  Name: Janet Poole MRN: [id number] Date of Birth: Jun 18, 1975 Referring Provider (SLP): Dr. Kathyrn Sheriff   Encounter Date: 03/25/2018  End of Session - 03/26/18 0845    Visit Number  8    Number of Visits  16    Date for SLP Re-Evaluation  04/25/18    SLP Start Time  1500    SLP Stop Time   1546    SLP Time Calculation (min)  46 min    Activity Tolerance  Patient tolerated treatment well       Past Medical History:  Diagnosis Date  . Family history of ovarian cancer    cancer genetic testing letter sent 1/18  . Lyme disease   . Ulcerative colitis [facility])     Past Surgical History:  Procedure Laterality Date  . BREAST CYST ASPIRATION  1999    There were no vitals filed for this visit.  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  The patient was provided with written and verbal teaching regarding neck, shoulder, tongue, and throat stretches exercises to promote relaxed phonation. The patient was provided with written and verbal teaching for supplemental vocal tract relaxation exercises (tongue and lip trills).  The patient was provided with written and verbal  teaching regarding breath support exercises.  The patient is now able to sustain /s/ for up to 30 seconds, indicating improved breath control.  The patient was provided with verbal, written and recorded instruction in resonant voice exercises:  Hum- Sustained.  The patient continues to have significant difficulty releasing laryngeal tension.  Her best voice is with voiced lip trill.  She can inconsistently transition to a hum with clear vocal quality.  The patient had improved success with semi-occluded vocal tract.  The patient was able to improve vocal quality for initial /m/ words, reading phrases, reading nursery rhymes, and rhyming words with long vowels.  Slowing rate is helpful. The patient will practice more unvoiced breath support/control activities.  The patient has much improved vocal quality overall, essentially no hoarseness or aphonic periods.  However, she continues to have muffled/excessive pharyngeal focus.  She demonstrates some improvement with constant flow phonation to encourage more oral resonance.      Assessment / Recommendations / Plan   Plan  Continue with current plan of care      Progression Toward Goals   Progression toward goals  Progressing toward goals       SLP Education - 03/26/18 0843    Education Details  issues to discuss with Dr. Lucienne Capers) Educated  Patient    Methods  Explanation  Comprehension  Verbalized understanding         SLP Long Term Goals - 03/26/18 4481      SLP LONG TERM GOAL #1   Title  The patient will demonstrate independent understanding of vocal hygiene concepts and extrinsic laryngeal muscle stretches.      Status  Achieved      SLP LONG TERM GOAL #2   Title  The patient will be independent for abdominal breathing and breath support exercises.    Status  Achieved      SLP LONG TERM GOAL #3   Title  The patient will minimize vocal tension via resonant voice therapy (or comparable technique) with min SLP cues with 80%  accuracy.    Time  4    Period  Weeks    Status  Partially Met    Target Date  04/25/18      SLP LONG TERM GOAL #4   Title  The patient will maximize voice quality and loudness using breath support/oral resonance for paragraph length recitation with 80% accuracy.    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  She continues to have muffled/excessive pharyngeal focus with minimal ability to achieve oral resonance, despite use of multiple techniques.  The patient may benefit from referral to a voice center for instrumental voice evaluation.  In the meantime, will plan to continue speech therapy with focus on oral resonance.    Speech Therapy Frequency  2x / week    Duration  4 weeks    Treatment/Interventions  SLP instruction and feedback;Patient/family education   Voice therapy   Potential to Achieve Goals  Good    Potential Considerations  Ability to learn/carryover information;Pain level;Family/community support;Co-morbidities;Previous level of function;Cooperation/participation level;Severity of impairments;Medical prognosis    SLP Home Exercise Plan  Provided    Consulted and Agree with Plan of Care  Patient       Patient will benefit from skilled therapeutic intervention in order to improve the following deficits and impairments:   Dysphonia - Plan: SLP plan of care cert/re-cert    Problem List There are no active problems to display for this patient.  Leroy Sea, MS/CCC- SLP  Lou Miner 03/26/2018, 8:54 AM  [place] MAIN [facility] SERVICES [address] [place], [place], [address] Phone: [phone]   Fax:   [phone]   Name: Janet Poole MRN: [id number] Date of Birth: 10-13-1975

## 2018-03-27 ENCOUNTER — Ambulatory Visit: Payer: BC Managed Care – PPO | Admitting: Speech Pathology

## 2018-04-01 ENCOUNTER — Ambulatory Visit: Payer: BC Managed Care – PPO | Admitting: Speech Pathology

## 2018-04-03 ENCOUNTER — Ambulatory Visit: Payer: BC Managed Care – PPO | Admitting: Speech Pathology

## 2018-04-08 ENCOUNTER — Ambulatory Visit: Payer: BC Managed Care – PPO | Admitting: Speech Pathology

## 2018-04-10 ENCOUNTER — Ambulatory Visit: Payer: BC Managed Care – PPO | Admitting: Speech Pathology

## 2018-04-15 ENCOUNTER — Ambulatory Visit: Payer: BC Managed Care – PPO | Admitting: Speech Pathology

## 2018-04-17 ENCOUNTER — Ambulatory Visit: Payer: BC Managed Care – PPO | Admitting: Speech Pathology

## 2018-04-17 ENCOUNTER — Encounter: Payer: BC Managed Care – PPO | Admitting: Speech Pathology

## 2018-04-22 ENCOUNTER — Ambulatory Visit: Payer: BC Managed Care – PPO | Admitting: Speech Pathology

## 2018-04-23 ENCOUNTER — Ambulatory Visit: Payer: BC Managed Care – PPO | Admitting: Speech Pathology

## 2018-05-20 ENCOUNTER — Telehealth: Payer: Self-pay | Admitting: Obstetrics & Gynecology

## 2018-05-20 NOTE — Telephone Encounter (Signed)
Patient scheduled 6/15 with [facility] to have mirena replaced.

## 2018-06-05 NOTE — Telephone Encounter (Signed)
Noted. Will order to arrive by apt date/time. 

## 2018-07-07 ENCOUNTER — Ambulatory Visit (INDEPENDENT_AMBULATORY_CARE_PROVIDER_SITE_OTHER): Payer: BC Managed Care – PPO | Admitting: Obstetrics & Gynecology

## 2018-07-07 ENCOUNTER — Encounter: Payer: Self-pay | Admitting: Obstetrics & Gynecology

## 2018-07-07 ENCOUNTER — Other Ambulatory Visit: Payer: Self-pay

## 2018-07-07 ENCOUNTER — Other Ambulatory Visit (HOSPITAL_COMMUNITY)
Admission: RE | Admit: 2018-07-07 | Discharge: 2018-07-07 | Disposition: A | Discharge status: Home / Self Care | Payer: BC Managed Care – PPO | Source: Ambulatory Visit | Attending: Obstetrics & Gynecology | Admitting: Obstetrics & Gynecology

## 2018-07-07 VITALS — BP 120/80 | Ht 60.0 in | Wt 120.0 lb

## 2018-07-07 DIAGNOSIS — Z124 Encounter for screening for malignant neoplasm of cervix: Secondary | ICD-10-CM | POA: Insufficient documentation

## 2018-07-07 DIAGNOSIS — R61 Generalized hyperhidrosis: Secondary | ICD-10-CM | POA: Insufficient documentation

## 2018-07-07 DIAGNOSIS — Z01419 Encounter for gynecological examination (general) (routine) without abnormal findings: Secondary | ICD-10-CM

## 2018-07-07 DIAGNOSIS — Z30433 Encounter for removal and reinsertion of intrauterine contraceptive device: Secondary | ICD-10-CM

## 2018-07-07 DIAGNOSIS — Z1239 Encounter for other screening for malignant neoplasm of breast: Secondary | ICD-10-CM

## 2018-07-07 NOTE — Patient Instructions (Addendum)
PAP every three years Mammogram every year    Call [phone] to schedule at [facility] today; also check with PCP   Intrauterine Device Insertion, Care After  This sheet gives you information about how to care for yourself after your procedure. Your health care provider may also give you more specific instructions. If you have problems or questions, contact your health care provider. What can I expect after the procedure? After the procedure, it is common to have:  Cramps and pain in the abdomen.  Light bleeding (spotting) or heavier bleeding that is like your menstrual period. This may last for up to a few days.  Lower back pain.  Dizziness.  Headaches.  Nausea. Follow these instructions at home:  Before resuming sexual activity, check to make sure that you can feel the IUD string(s). You should be able to feel the end of the string(s) below the opening of your cervix. If your IUD string is in place, you may resume sexual activity. ? If you had a hormonal IUD inserted more than 7 days after your most recent period started, you will need to use a backup method of birth control for 7 days after IUD insertion. Ask your health care provider whether this applies to you.  Continue to check that the IUD is still in place by feeling for the string(s) after every menstrual period, or once a month.  Take over-the-counter and prescription medicines only as told by your health care provider.  Do not drive or use heavy machinery while taking prescription pain medicine.  Keep all follow-up visits as told by your health care provider. This is important. Contact a health care provider if:  You have bleeding that is heavier or lasts longer than a normal menstrual cycle.  You have a fever.  You have cramps or abdominal pain that get worse or do not get better with medicine.  You develop abdominal pain that is new or is not in the same area of earlier cramping and pain.  You  feel lightheaded or weak.  You have abnormal or bad-smelling discharge from your vagina.  You have pain during sexual activity.  You have any of the following problems with your IUD string(s): ? The string bothers or hurts you or your sexual partner. ? You cannot feel the string. ? The string has gotten longer.  You can feel the IUD in your vagina.  You think you may be pregnant, or you miss your menstrual period.  You think you may have an STI (sexually transmitted infection). Get help right away if:  You have flu-like symptoms.  You have a fever and chills.  You can feel that your IUD has slipped out of place. Summary  After the procedure, it is common to have cramps and pain in the abdomen. It is also common to have light bleeding (spotting) or heavier bleeding that is like your menstrual period.  Continue to check that the IUD is still in place by feeling for the string(s) after every menstrual period, or once a month.  Keep all follow-up visits as told by your health care provider. This is important.  Contact your health care provider if you have problems with your IUD string(s), such as the string getting longer or bothering you or your sexual partner. This information is not intended to replace advice given to you by your health care provider. Make sure you discuss any questions you have with your health care provider. Document Released: 09/06/2010 Document  Revised: 11/30/2015 Document Reviewed: 11/30/2015 Elsevier Interactive Patient Education  [employer].

## 2018-07-07 NOTE — Progress Notes (Signed)
HPI:      Janet Poole is a 43 y.o. [phone] who LMP was No LMP recorded. (Menstrual status: IUD)., she presents today for her annual examination. The patient has no complaints today other than new onset intermittent night sweats. The patient is sexually active. Her last pap: approximate date 2017 and was normal and last mammogram: approximate date 2019 and was normal. The patient does perform self breast exams.  There is notable family history of breast or ovarian cancer in her family. Mom is BRCA neg.  The patient has regular exercise: yes.  The patient denies current symptoms of depression.    GYN History: Contraception: vasectomy  Mirena IUD for period and mood control, has done well over the years, due for exchange  PMHx: Past Medical History:  Diagnosis Date  . Family history of ovarian cancer    cancer genetic testing letter sent 1/18  . Lyme disease   . Ulcerative colitis [facility])    Past Surgical History:  Procedure Laterality Date  . BREAST CYST ASPIRATION  1999   Family History  Problem Relation Age of Onset  . Breast cancer Paternal Grandmother 50  . Ovarian cancer Maternal Grandmother 72  . Hypertension Mother   . Hypertension Father   . Stroke Father   . Pancreatic cancer Maternal Aunt 63   Social History   Tobacco Use  . Smoking status: Never Smoker  . Smokeless tobacco: Never Used  Substance Use Topics  . Alcohol use: Yes    Comment: occasionally  . Drug use: No    Current Outpatient Medications:  .  mesalamine (LIALDA) 1.2 g EC tablet, Take by mouth daily with breakfast., Disp: , Rfl:  .  PARoxetine (PAXIL) 30 MG tablet, Take 30 mg by mouth daily., Disp: , Rfl:  Allergies: Cefdinir, Metronidazole, Sulfa antibiotics, and Sulfasalazine  Review of Systems  Constitutional: Positive for diaphoresis. Negative for chills, fever and malaise/fatigue.  HENT: Negative for congestion, sinus pain and sore throat.   Eyes: Negative for blurred vision and pain.   Respiratory: Negative for cough and wheezing.   Cardiovascular: Negative for chest pain and leg swelling.  Gastrointestinal: Negative for abdominal pain, constipation, diarrhea, heartburn, nausea and vomiting.  Genitourinary: Negative for dysuria, frequency, hematuria and urgency.  Musculoskeletal: Negative for back pain, joint pain, myalgias and neck pain.  Skin: Negative for itching and rash.  Neurological: Negative for dizziness, tremors and weakness.  Endo/Heme/Allergies: Does not bruise/bleed easily.  Psychiatric/Behavioral: Negative for depression. The patient is not nervous/anxious and does not have insomnia.     Objective: BP 120/80   Ht 5' (1.524 m)   Wt 120 lb (54.4 kg)   BMI 23.44 kg/m   Filed Weights   07/07/18 0804  Weight: 120 lb (54.4 kg)   Body mass index is 23.44 kg/m. Physical Exam Constitutional:      General: She is not in acute distress.    Appearance: She is well-developed.  Genitourinary:     Pelvic exam was performed with patient supine.     Vagina, uterus and rectum normal.     No lesions in the vagina.     No vaginal bleeding.     No cervical motion tenderness, friability, lesion or polyp.     IUD strings visualized.     Uterus is mobile.     Uterus is not enlarged.     No uterine mass detected.    Uterus is midaxial.     No right or left  adnexal mass present.     Right adnexa not tender.     Left adnexa not tender.  HENT:     Head: Normocephalic and atraumatic. No laceration.     Right Ear: Hearing normal.     Left Ear: Hearing normal.     Mouth/Throat:     Pharynx: Uvula midline.  Eyes:     Pupils: Pupils are equal, round, and reactive to light.  Neck:     Musculoskeletal: Normal range of motion and neck supple.     Thyroid: No thyromegaly.  Cardiovascular:     Rate and Rhythm: Normal rate and regular rhythm.     Heart sounds: No murmur. No friction rub. No gallop.   Pulmonary:     Effort: Pulmonary effort is normal. No respiratory  distress.     Breath sounds: Normal breath sounds. No wheezing.  Chest:     Breasts:        Right: No mass, skin change or tenderness.        Left: No mass, skin change or tenderness.  Abdominal:     General: Bowel sounds are normal. There is no distension.     Palpations: Abdomen is soft.     Tenderness: There is no abdominal tenderness. There is no rebound.  Musculoskeletal: Normal range of motion.  Neurological:     Mental Status: She is alert and oriented to person, place, and time.     Cranial Nerves: No cranial nerve deficit.  Skin:    General: Skin is warm and dry.  Psychiatric:        Judgment: Judgment normal.  Vitals signs reviewed.     Assessment:  ANNUAL EXAM 1. Encounter for removal and reinsertion of intrauterine contraceptive device   2. Women's annual routine gynecological examination   3. Screening for breast cancer   4. Screening for cervical cancer   5. Night sweats      Screening Plan:            1.  Cervical Screening-  Pap smear done today  2. Breast screening- Exam annually and mammogram>40 planned   3. Colonoscopy every 10 years, Hemoccult testing - after age 33  4. Labs Ordered today  5. Counseling for contraception: vasectomy   6. Night sweats, cont Mirena (new one today) and also check TSH   IUD PROCEDURE NOTE:  Janet Poole is a 43 y.o. [phone] here for IUD removal and insertion. No GYN concerns.  Last pap smear was normal, also repeated today.  IUD Removal Strings of IUD identified and grasped.  IUD removed without problem.  Pt tolerated this well.  IUD noted to be intact.  IUD Insertion Procedure Note Patient identified, informed consent performed, consent signed.   Discussed risks of irregular bleeding, cramping, infection, malpositioning or misplacement of the IUD outside the uterus which may require further procedure such as laparoscopy, risk of failure <1%. Time out was performed.  Urine pregnancy test negative.  A bimanual  exam showed the uterus to be midposition.  Speculum placed in the vagina.  Cervix visualized.  Cleaned with Betadine x 2.  Grasped anteriorly with a single tooth tenaculum.  Uterus sounded to 7 cm.   IUD placed per manufacturer's recommendations.  Strings trimmed to 3 cm. Tenaculum was removed, good hemostasis noted.  Patient tolerated procedure well.   Patient was given post-procedure instructions.  She was advised to have backup contraception for one week.  Patient was also asked to check IUD strings  periodically and follow up in 4 weeks for IUD check.    F/U  Return in about 4 weeks (around 08/04/2018) for Follow up.  Barnett Applebaum, MD, [facility], [facility] 07/07/2018  8:30 AM

## 2018-07-08 ENCOUNTER — Encounter: Payer: Self-pay | Admitting: Obstetrics and Gynecology

## 2018-07-08 LAB — CYTOLOGY - PAP
Diagnosis: NEGATIVE
HPV: NOT DETECTED

## 2018-07-08 LAB — TSH: TSH: 0.638 u[IU]/mL (ref 0.450–4.500)

## 2018-07-17 ENCOUNTER — Ambulatory Visit
Admission: RE | Admit: 2018-07-17 | Discharge: 2018-07-17 | Disposition: A | Discharge status: Home / Self Care | Payer: BC Managed Care – PPO | Source: Ambulatory Visit | Attending: Obstetrics & Gynecology | Admitting: Obstetrics & Gynecology

## 2018-07-17 ENCOUNTER — Other Ambulatory Visit: Payer: Self-pay

## 2018-07-17 DIAGNOSIS — Z1239 Encounter for other screening for malignant neoplasm of breast: Secondary | ICD-10-CM

## 2018-07-17 DIAGNOSIS — Z1231 Encounter for screening mammogram for malignant neoplasm of breast: Secondary | ICD-10-CM | POA: Diagnosis not present

## 2019-03-21 ENCOUNTER — Other Ambulatory Visit: Payer: Self-pay

## 2019-03-21 ENCOUNTER — Ambulatory Visit: Payer: BC Managed Care – PPO | Attending: Internal Medicine

## 2019-03-21 DIAGNOSIS — Z23 Encounter for immunization: Secondary | ICD-10-CM | POA: Insufficient documentation

## 2019-03-21 NOTE — Progress Notes (Signed)
   Covid-19 Vaccination Clinic  Name:  Janet Poole    MRN: [id number] DOB: Nov 28, 1975  03/21/2019  Ms. Charlet was observed post Covid-19 immunization for 15 minutes without incidence. She was provided with Vaccine Information Sheet and instruction to access the V-Safe system.   Ms. Buckel was instructed to call 911 with any severe reactions post vaccine: Marland Kitchen Difficulty breathing  . Swelling of your face and throat  . A fast heartbeat  . A bad rash all over your body  . Dizziness and weakness    Immunizations Administered    Name Date Dose VIS Date Route   Moderna COVID-19 Vaccine 03/21/2019 12:10 PM 0.5 mL 12/23/2018 Intramuscular   Manufacturer: Moderna   Lot: [id number]   NDC: 48616-122-40

## 2019-04-18 ENCOUNTER — Ambulatory Visit: Payer: BC Managed Care – PPO | Attending: Internal Medicine

## 2019-04-18 DIAGNOSIS — Z23 Encounter for immunization: Secondary | ICD-10-CM

## 2019-04-18 NOTE — Progress Notes (Signed)
   Covid-19 Vaccination Clinic  Name:  Janet Poole    MRN: [id number] DOB: 1975-07-11  04/18/2019  Ms. Albert was observed post Covid-19 immunization for 15 minutes without incident. She was provided with Vaccine Information Sheet and instruction to access the V-Safe system.   Ms. Theard was instructed to call 911 with any severe reactions post vaccine: Marland Kitchen Difficulty breathing  . Swelling of face and throat  . A fast heartbeat  . A bad rash all over body  . Dizziness and weakness   Immunizations Administered    Name Date Dose VIS Date Route   Moderna COVID-19 Vaccine 04/18/2019 12:00 PM 0.5 mL 12/23/2018 Intramuscular   Manufacturer: Gala Murdoch   Lot: [id number]   NDC: 02111-735-67

## 2019-07-29 ENCOUNTER — Ambulatory Visit: Payer: BC Managed Care – PPO | Admitting: Obstetrics & Gynecology

## 2019-08-17 ENCOUNTER — Encounter: Payer: Self-pay | Admitting: Obstetrics & Gynecology

## 2019-08-17 ENCOUNTER — Other Ambulatory Visit: Payer: Self-pay

## 2019-08-17 ENCOUNTER — Ambulatory Visit (INDEPENDENT_AMBULATORY_CARE_PROVIDER_SITE_OTHER): Payer: BC Managed Care – PPO | Admitting: Obstetrics & Gynecology

## 2019-08-17 VITALS — BP 120/80 | Ht 60.0 in | Wt 120.0 lb

## 2019-08-17 DIAGNOSIS — Z01419 Encounter for gynecological examination (general) (routine) without abnormal findings: Secondary | ICD-10-CM

## 2019-08-17 DIAGNOSIS — Z1231 Encounter for screening mammogram for malignant neoplasm of breast: Secondary | ICD-10-CM

## 2019-08-17 NOTE — Patient Instructions (Addendum)
PAP every three years Mammogram every year    Call [phone] to schedule at [facility] yearly (with PCP)  Thank you for choosing [facility]. As part of our ongoing efforts to improve patient experience, we would appreciate your feedback. Please fill out the short survey that you will receive by mail or MyChart. Your opinion is important to [place]!   Kegel Exercises  Kegel exercises can help strengthen your pelvic floor muscles. The pelvic floor is a group of muscles that support your rectum, small intestine, and bladder. In females, pelvic floor muscles also help support the womb (uterus). These muscles help you control the flow of urine and stool. Kegel exercises are painless and simple, and they do not require any equipment. Your provider may suggest Kegel exercises to:  Improve bladder and bowel control.  Improve sexual response.  Improve weak pelvic floor muscles after surgery to remove the uterus (hysterectomy) or pregnancy (females).  Improve weak pelvic floor muscles after prostate gland removal or surgery (males). Kegel exercises involve squeezing your pelvic floor muscles, which are the same muscles you squeeze when you try to stop the flow of urine or keep from passing gas. The exercises can be done while sitting, standing, or lying down, but it is best to vary your position. Exercises How to do Kegel exercises: 1. Squeeze your pelvic floor muscles tight. You should feel a tight lift in your rectal area. If you are a female, you should also feel a tightness in your vaginal area. Keep your stomach, buttocks, and legs relaxed. 2. Hold the muscles tight for up to 10 seconds. 3. Breathe normally. 4. Relax your muscles. 5. Repeat as told by your health care provider. Repeat this exercise daily as told by your health care provider. Continue to do this exercise for at least 4-6 weeks, or for as long as told by your health care provider. You may be referred to a physical  therapist who can help you learn more about how to do Kegel exercises. Depending on your condition, your health care provider may recommend:  Varying how long you squeeze your muscles.  Doing several sets of exercises every day.  Doing exercises for several weeks.  Making Kegel exercises a part of your regular exercise routine. This information is not intended to replace advice given to you by your health care provider. Make sure you discuss any questions you have with your health care provider. Document Revised: 08/28/2017 Document Reviewed: 08/28/2017 Elsevier Patient Education  2020 [employer].

## 2019-08-17 NOTE — Progress Notes (Addendum)
HPI:      Janet Poole is a 44 y.o. [phone] who LMP was No LMP recorded. (Menstrual status: IUD)., she presents today for her annual examination. The patient has no complaints today. The patient is sexually active. Her last pap: approximate date 2020 and was normal and last mammogram: approximate date 2020 and was normal. The patient does perform self breast exams.  There is notable family history of breast or ovarian cancer in her family.  The patient has regular exercise: yes.  The patient denies current symptoms of depression.    GYN History: Contraception: vasectomy and she also uses Mirena (year 1) for period and mood control   PMHx: Past Medical History:  Diagnosis Date  . Family history of ovarian cancer    cancer genetic testing letter sent 1/18, 6/20  . Lyme disease   . Ulcerative colitis [facility])    Past Surgical History:  Procedure Laterality Date  . BREAST CYST ASPIRATION  1999   neg   Family History  Problem Relation Age of Onset  . Breast cancer Paternal Grandmother 69  . Ovarian cancer Maternal Grandmother 72  . Hypertension Mother   . Other Mother        BRCA neg  . Hypertension Father   . Stroke Father   . Atrial fibrillation Father   . Pancreatic cancer Maternal Aunt 39   Social History   Tobacco Use  . Smoking status: Never Smoker  . Smokeless tobacco: Never Used  Vaping Use  . Vaping Use: Never used  Substance Use Topics  . Alcohol use: Yes    Comment: occasionally  . Drug use: No    Current Outpatient Medications:  .  levonorgestrel (MIRENA) 20 MCG/24HR IUD, by Intrauterine route., Disp: , Rfl:  .  mesalamine (LIALDA) 1.2 g EC tablet, Take by mouth daily with breakfast., Disp: , Rfl:  .  PARoxetine (PAXIL) 30 MG tablet, Take 30 mg by mouth daily., Disp: , Rfl:  Allergies: Cefdinir, Metronidazole, Sulfa antibiotics, and Sulfasalazine  Review of Systems  Constitutional: Negative for chills, fever and malaise/fatigue.  HENT: Negative for  congestion, sinus pain and sore throat.   Eyes: Negative for blurred vision and pain.  Respiratory: Negative for cough and wheezing.   Cardiovascular: Negative for chest pain and leg swelling.  Gastrointestinal: Negative for abdominal pain, constipation, diarrhea, heartburn, nausea and vomiting.  Genitourinary: Negative for dysuria, frequency, hematuria and urgency.  Musculoskeletal: Negative for back pain, joint pain, myalgias and neck pain.  Skin: Negative for itching and rash.  Neurological: Negative for dizziness, tremors and weakness.  Endo/Heme/Allergies: Does not bruise/bleed easily.  Psychiatric/Behavioral: Negative for depression. The patient is not nervous/anxious and does not have insomnia.     Objective: BP 120/80   Ht 5' (1.524 m)   Wt 120 lb (54.4 kg)   BMI 23.44 kg/m   Filed Weights   08/17/19 1602  Weight: 120 lb (54.4 kg)   Body mass index is 23.44 kg/m. Physical Exam Constitutional:      General: She is not in acute distress.    Appearance: She is well-developed.  Genitourinary:     Pelvic exam was performed with patient supine.     Urethra, bladder, vagina, uterus and rectum normal.     No lesions in the vagina.     No vaginal bleeding.     No cervical motion tenderness, friability, lesion or polyp.     IUD strings visualized.     Uterus is  mobile.     Uterus is not enlarged.     No uterine mass detected.    Uterus is midaxial.     No right or left adnexal mass present.     Right adnexa not tender.     Left adnexa not tender.  HENT:     Head: Normocephalic and atraumatic. No laceration.     Right Ear: Hearing normal.     Left Ear: Hearing normal.     Mouth/Throat:     Pharynx: Uvula midline.  Eyes:     Pupils: Pupils are equal, round, and reactive to light.  Neck:     Thyroid: No thyromegaly.  Cardiovascular:     Rate and Rhythm: Normal rate and regular rhythm.     Heart sounds: No murmur heard.  No friction rub. No gallop.   Pulmonary:      Effort: Pulmonary effort is normal. No respiratory distress.     Breath sounds: Normal breath sounds. No wheezing.  Chest:     Breasts:        Right: No mass, skin change or tenderness.        Left: No mass, skin change or tenderness.  Abdominal:     General: Bowel sounds are normal. There is no distension.     Palpations: Abdomen is soft.     Tenderness: There is no abdominal tenderness. There is no rebound.  Musculoskeletal:        General: Normal range of motion.     Cervical back: Normal range of motion and neck supple.  Neurological:     Mental Status: She is alert and oriented to person, place, and time.     Cranial Nerves: No cranial nerve deficit.  Skin:    General: Skin is warm and dry.  Psychiatric:        Judgment: Judgment normal.  Vitals reviewed.     Assessment:  ANNUAL EXAM 1. Women's annual routine gynecological examination   2. Encounter for screening mammogram for malignant neoplasm of breast      Screening Plan:            1.  Cervical Screening-  Pap smear schedule reviewed with patient  2. Breast screening- Exam annually and mammogram>40 planned   3. Colonoscopy every 10 years, Hemoccult testing - after age 43  4. Labs up to date  5. Counseling for contraception: vasectomy   6. Cont IUD Mirena for cycle and mood control  7. Kegels advised for mild form of GSI  8. Night sweats, occas, no tx     F/U  Return in about 1 year (around 08/16/2020) for Annual.  Barnett Applebaum, MD, [facility], [facility] 09/16/2019  7:58 PM

## 2019-09-15 ENCOUNTER — Ambulatory Visit
Admission: RE | Admit: 2019-09-15 | Discharge: 2019-09-15 | Disposition: A | Discharge status: Home / Self Care | Payer: BC Managed Care – PPO | Source: Ambulatory Visit | Attending: Obstetrics & Gynecology | Admitting: Obstetrics & Gynecology

## 2019-09-15 ENCOUNTER — Other Ambulatory Visit: Payer: Self-pay

## 2019-09-15 DIAGNOSIS — Z1231 Encounter for screening mammogram for malignant neoplasm of breast: Secondary | ICD-10-CM

## 2019-09-17 ENCOUNTER — Other Ambulatory Visit: Payer: Self-pay | Admitting: Obstetrics & Gynecology

## 2019-09-17 DIAGNOSIS — R921 Mammographic calcification found on diagnostic imaging of breast: Secondary | ICD-10-CM

## 2019-09-17 DIAGNOSIS — N6459 Other signs and symptoms in breast: Secondary | ICD-10-CM

## 2019-09-25 ENCOUNTER — Ambulatory Visit
Admission: RE | Admit: 2019-09-25 | Discharge: 2019-09-25 | Disposition: A | Discharge status: Home / Self Care | Payer: BC Managed Care – PPO | Source: Ambulatory Visit | Attending: Obstetrics & Gynecology | Admitting: Obstetrics & Gynecology

## 2019-09-25 ENCOUNTER — Other Ambulatory Visit: Payer: Self-pay

## 2019-09-25 DIAGNOSIS — N6459 Other signs and symptoms in breast: Secondary | ICD-10-CM | POA: Insufficient documentation

## 2019-09-25 DIAGNOSIS — R921 Mammographic calcification found on diagnostic imaging of breast: Secondary | ICD-10-CM | POA: Diagnosis present

## 2020-02-29 IMAGING — MG DIGITAL SCREENING BILATERAL MAMMOGRAM WITH TOMO AND CAD
8 series · 9 of 24 positions shown · non-contrast
Comparison: Previous exam(s).

CLINICAL DATA: Screening.

EXAM:
DIGITAL SCREENING BILATERAL MAMMOGRAM WITH TOMO AND CAD

[L CC synth-2D]
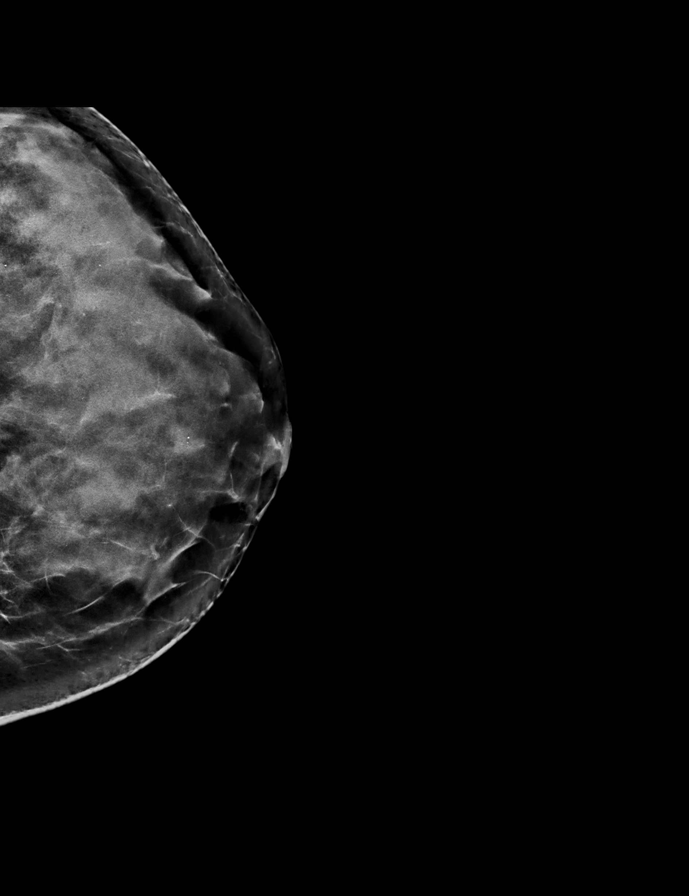

[R CC synth-2D]
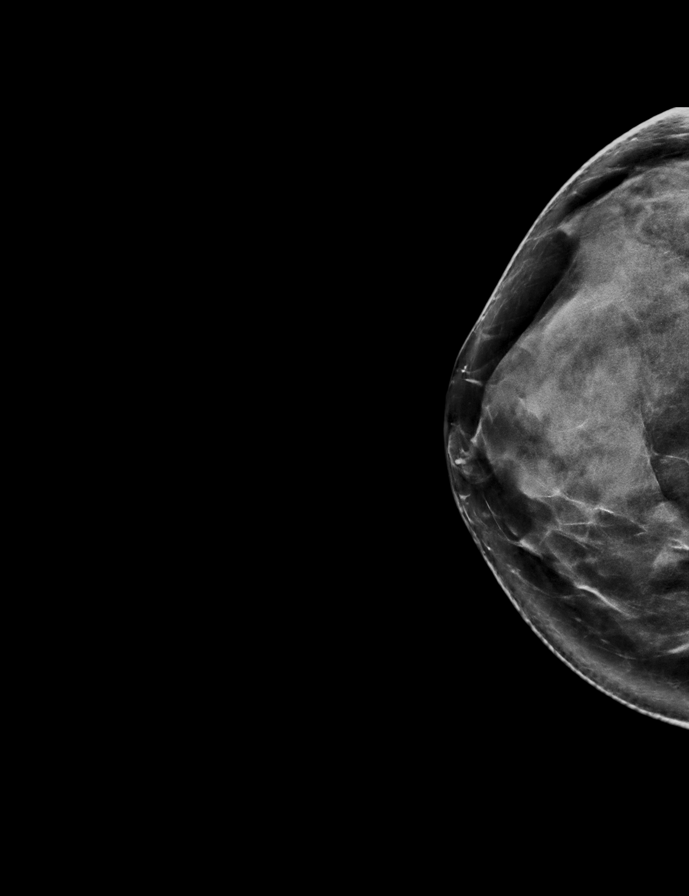

[R MLO synth-2D]
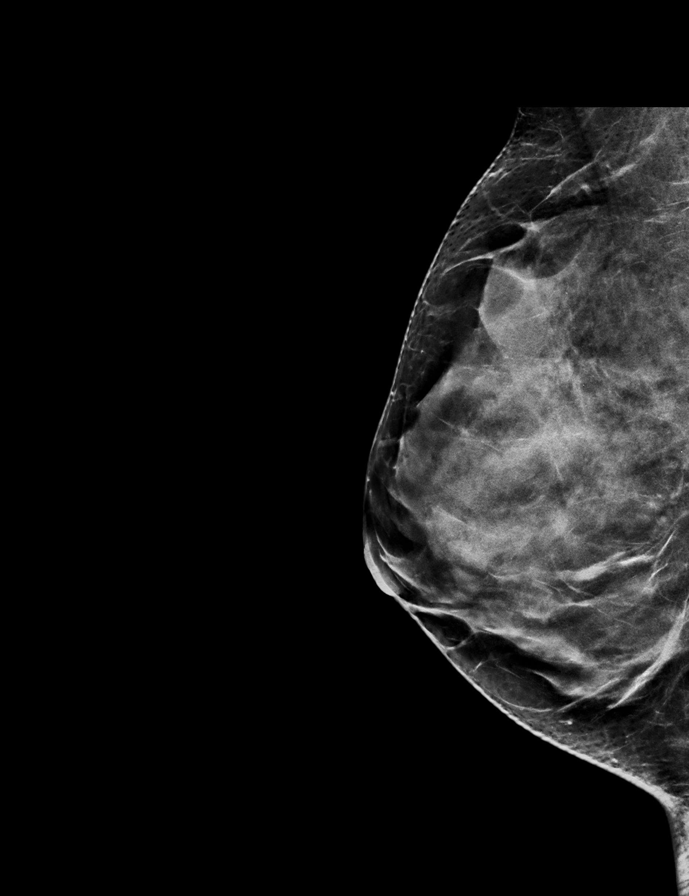

[L MLO synth-2D]
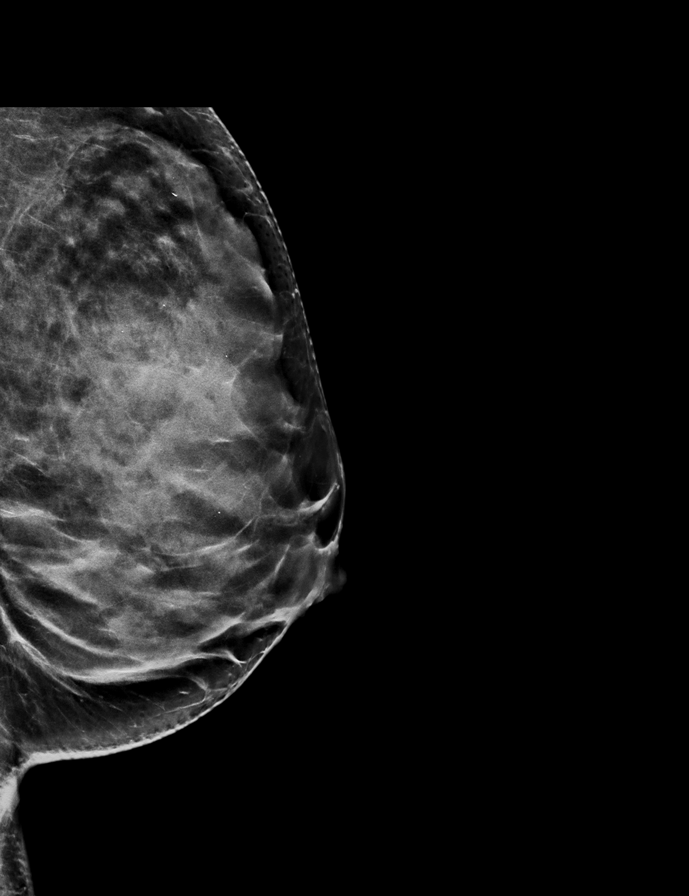

[R CC tomo · 2 of 67 frames shown]
[frame 22/67]
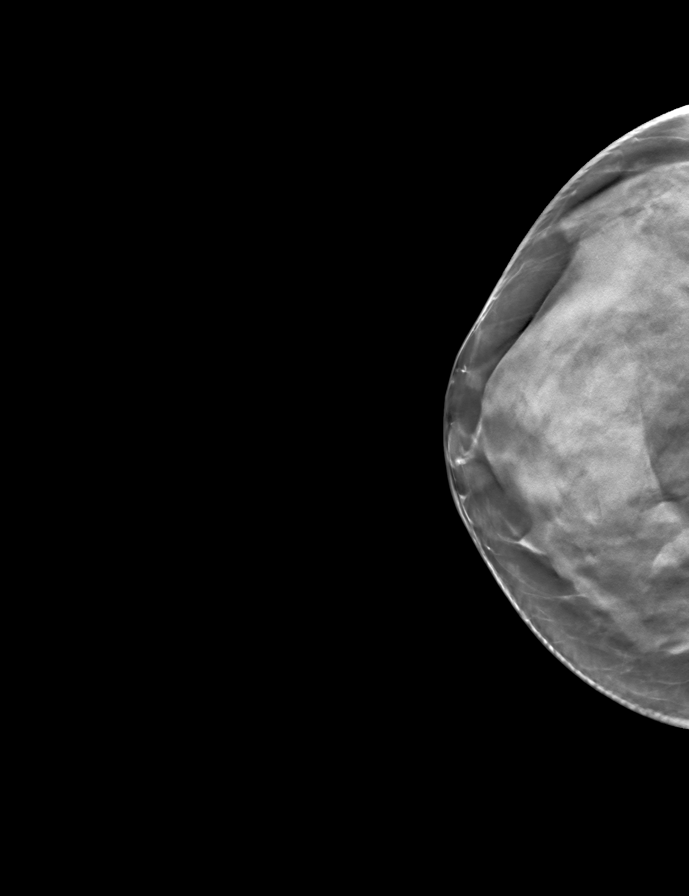
[frame 34/67]
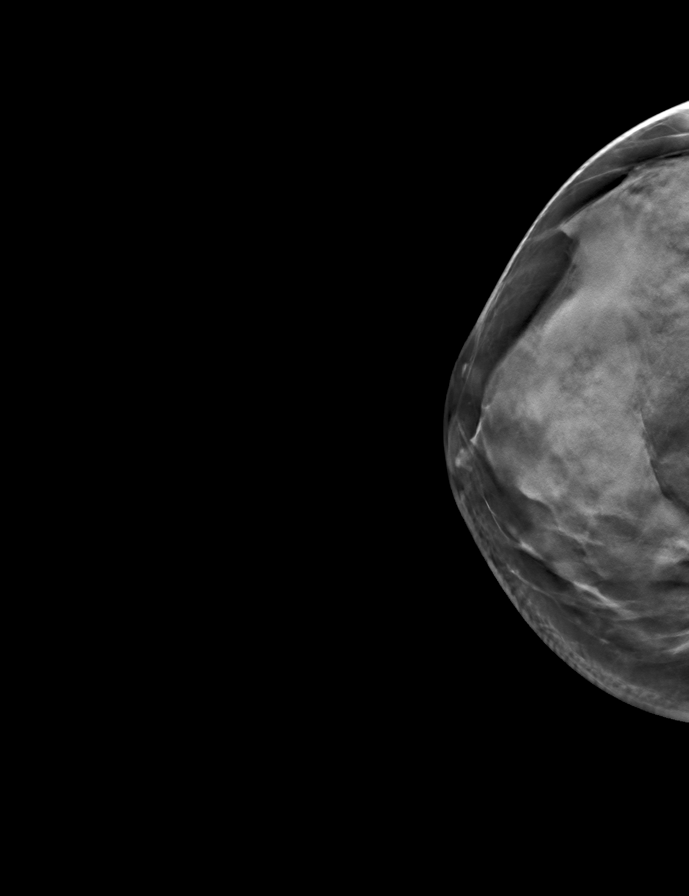

[L MLO tomo · tomo slice 32/63.0]
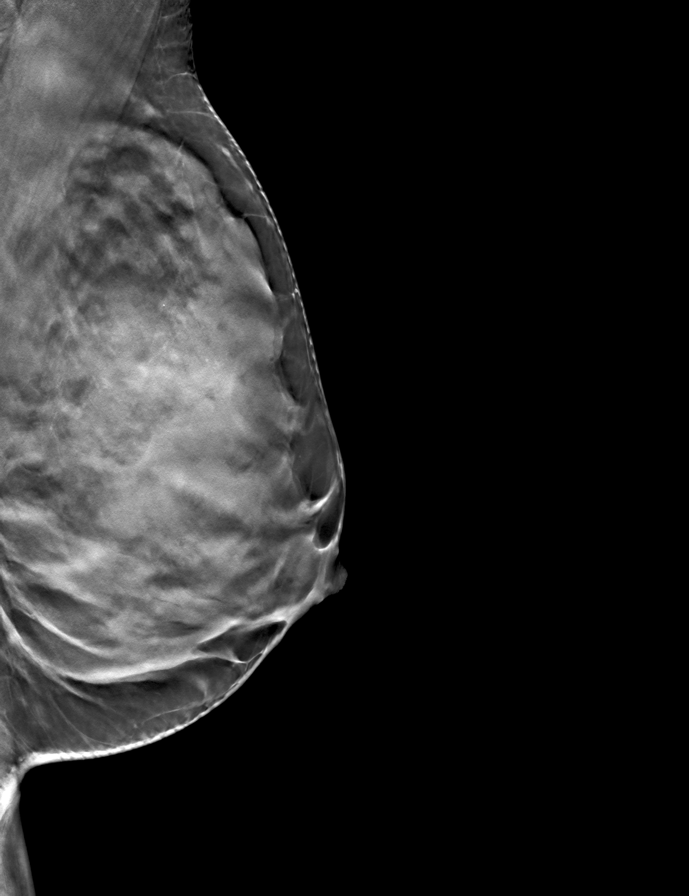

[R MLO tomo · tomo slice 30/59.0]
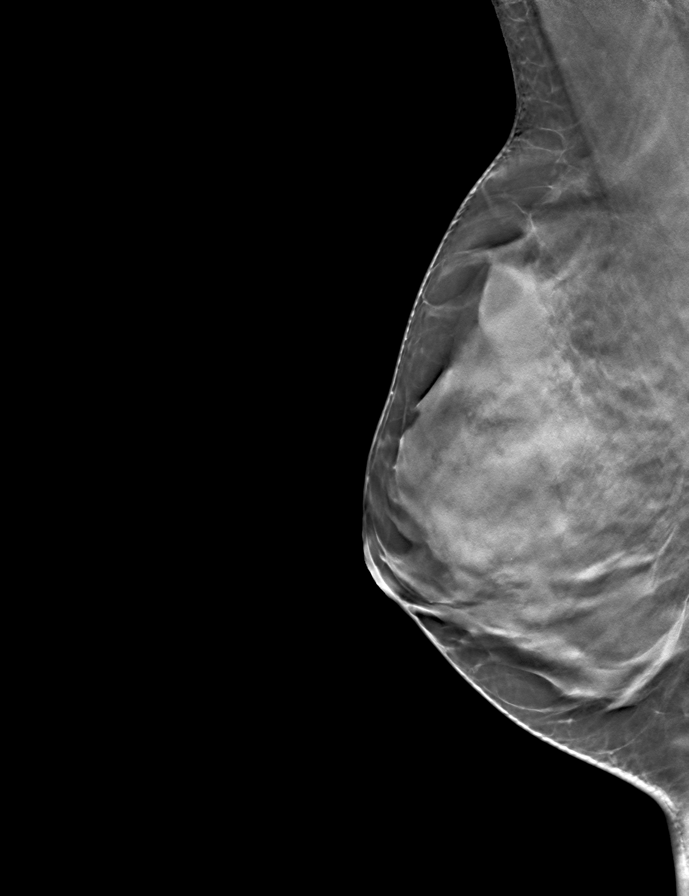

[L CC tomo · tomo slice 33/66.0]
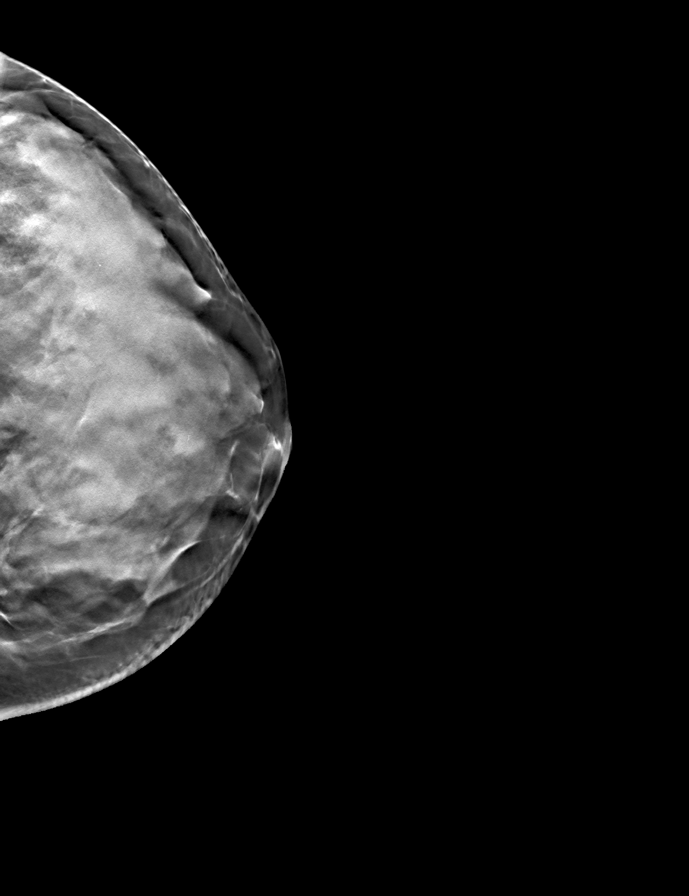

[9 of 24 positions shown; findings below may reference images not displayed]

ACR Breast Density Category d: The breast tissue is extremely dense,
which lowers the sensitivity of mammography
FINDINGS: There are no findings suspicious for malignancy. Images were
processed with CAD.
IMPRESSION: No mammographic evidence of malignancy. A result letter of this
screening mammogram will be mailed directly to the patient.

RECOMMENDATION:
Screening mammogram in one year. (Code:WO-0-ZI0)

BI-RADS CATEGORY  1: Negative.

## 2020-06-06 ENCOUNTER — Other Ambulatory Visit: Payer: Self-pay

## 2020-06-06 ENCOUNTER — Ambulatory Visit
Admission: RE | Admit: 2020-06-06 | Discharge: 2020-06-06 | Disposition: A | Discharge status: Home / Self Care | Payer: BC Managed Care – PPO | Source: Ambulatory Visit | Attending: Emergency Medicine | Admitting: Emergency Medicine

## 2020-06-06 VITALS — BP 125/91 | HR 78 | Temp 98.4°F | Resp 18 | Ht 60.0 in | Wt 119.9 lb

## 2020-06-06 DIAGNOSIS — S20362A Insect bite (nonvenomous) of left front wall of thorax, initial encounter: Secondary | ICD-10-CM

## 2020-06-06 DIAGNOSIS — M2550 Pain in unspecified joint: Secondary | ICD-10-CM | POA: Diagnosis not present

## 2020-06-06 DIAGNOSIS — R21 Rash and other nonspecific skin eruption: Secondary | ICD-10-CM | POA: Diagnosis not present

## 2020-06-06 DIAGNOSIS — W57XXXA Bitten or stung by nonvenomous insect and other nonvenomous arthropods, initial encounter: Secondary | ICD-10-CM | POA: Diagnosis not present

## 2020-06-06 MED ORDER — DOXYCYCLINE HYCLATE 100 MG PO CAPS: 100.0000 mg | ORAL_CAPSULE | Freq: Two times a day (BID) | ORAL | 0 refills | Status: AC

## 2020-06-06 NOTE — ED Provider Notes (Signed)
[facility]    CSN: [id number] Arrival date & time: 06/06/20  1145      History   Chief Complaint Chief Complaint  Patient presents with  . Nasal Congestion  . Rash  . Fever    HPI Salley Jeff Mccallum is a 45 y.o. female.   HPI   45 year old female here for evaluation of multiple complaints.  Patient reports that 4 days ago she noticed some sort of bug bite on the left side of her rib cage.  The area is red and painful.  Yesterday she developed joint pain, fever with a T-max of 101.7, ear pressure, chills, and nasal congestion.  She denies runny nose, sore throat, cough, shortness breath or wheezing, or GI complaints.  Patient has been treated for Lyme in the past.  Past Medical History:  Diagnosis Date  . Family history of ovarian cancer    cancer genetic testing letter sent 1/18, 6/20  . Lyme disease   . Ulcerative colitis [facility])     Patient Active Problem List   Diagnosis Date Noted  . Night sweats 07/07/2018    Past Surgical History:  Procedure Laterality Date  . BREAST CYST ASPIRATION  1999   neg    OB History    Gravida  2   Para  2   Term  2   Preterm      AB      Living  2     SAB      IAB      Ectopic      Multiple      Live Births               Home Medications    Prior to Admission medications   Medication Sig Start Date End Date Taking? Authorizing Provider  doxycycline (VIBRAMYCIN) 100 MG capsule Take 1 capsule (100 mg total) by mouth 2 (two) times daily for 14 days. 06/06/20 06/20/20 Yes Margarette Canada, NP  levonorgestrel (MIRENA) 20 MCG/24HR IUD by Intrauterine route.   Yes [provider]  mesalamine (LIALDA) 1.2 g EC tablet Take by mouth daily with breakfast.   Yes [provider]  PARoxetine (PAXIL) 30 MG tablet Take 30 mg by mouth daily.   Yes [provider]    Family History Family History  Problem Relation Age of Onset  . Breast cancer Paternal Grandmother 21  . Ovarian  cancer Maternal Grandmother 72  . Hypertension Mother   . Other Mother        BRCA neg  . Hypertension Father   . Stroke Father   . Atrial fibrillation Father   . Pancreatic cancer Maternal Aunt 60    Social History Social History   Tobacco Use  . Smoking status: Never Smoker  . Smokeless tobacco: Never Used  Vaping Use  . Vaping Use: Never used  Substance Use Topics  . Alcohol use: Yes    Comment: occasionally  . Drug use: No     Allergies   Cefdinir, Metronidazole, Sulfa antibiotics, and Sulfasalazine   Review of Systems Review of Systems  Constitutional: Positive for chills and fever.  HENT: Positive for congestion and ear pain. Negative for rhinorrhea and sore throat.   Respiratory: Negative for cough, shortness of breath and wheezing.   Gastrointestinal: Negative for abdominal pain, diarrhea, nausea and vomiting.  Musculoskeletal: Positive for arthralgias.  Skin: Positive for color change and wound.  Neurological: Positive for headaches.  Hematological: Negative.   Psychiatric/Behavioral: Negative.  Physical Exam Triage Vital Signs ED Triage Vitals  Enc Vitals Group     BP      Pulse      Resp      Temp      Temp src      SpO2      Weight      Height      Head Circumference      Peak Flow      Pain Score      Pain Loc      Pain Edu?      Excl. in [place]?    No data found.  Updated Vital Signs BP (!) 125/91 (BP Location: Right Arm)   Pulse 78   Temp 98.4 F (36.9 C) (Oral)   Resp 18   Ht 5' (1.524 m)   Wt 119 lb 14.9 oz (54.4 kg)   SpO2 99%   BMI 23.42 kg/m   Visual Acuity Right Eye Distance:   Left Eye Distance:   Bilateral Distance:    Right Eye Near:   Left Eye Near:    Bilateral Near:     Physical Exam Vitals and nursing note reviewed.  Constitutional:      General: She is not in acute distress.    Appearance: Normal appearance. She is not ill-appearing.  HENT:     Head: Normocephalic and atraumatic.     Right Ear:  Tympanic membrane, ear canal and external ear normal. There is no impacted cerumen.     Left Ear: Tympanic membrane, ear canal and external ear normal. There is no impacted cerumen.     Nose: Nose normal. No congestion or rhinorrhea.     Mouth/Throat:     Mouth: Mucous membranes are moist.     Pharynx: Oropharynx is clear. No oropharyngeal exudate or posterior oropharyngeal erythema.  Cardiovascular:     Rate and Rhythm: Normal rate and regular rhythm.     Pulses: Normal pulses.     Heart sounds: Normal heart sounds. No murmur heard. No gallop.   Pulmonary:     Effort: Pulmonary effort is normal.     Breath sounds: Normal breath sounds. No wheezing, rhonchi or rales.  Musculoskeletal:     Cervical back: Normal range of motion and neck supple.  Lymphadenopathy:     Cervical: No cervical adenopathy.  Skin:    General: Skin is warm and dry.     Capillary Refill: Capillary refill takes less than 2 seconds.     Findings: Erythema and lesion present.  Neurological:     General: No focal deficit present.     Mental Status: She is alert.  Psychiatric:        Mood and Affect: Mood normal.        Behavior: Behavior normal.        Thought Content: Thought content normal.        Judgment: Judgment normal.      UC Treatments / Results  Labs (all labs ordered are listed, but only abnormal results are displayed) Labs Reviewed - No data to display  EKG   Radiology No results found.  Procedures Procedures (including critical care time)  Medications Ordered in UC Medications - No data to display  Initial Impression / Assessment and Plan / UC Course  I have reviewed the triage vital signs and the nursing notes.  Pertinent labs & imaging results that were available during my care of the patient were reviewed by me and considered in  my medical decision making (see chart for details).   Patient is a very pleasant 46 year old female here for evaluation of a bite on left side of her  rib cage as well as nasal congestion, fever with a T-max of 101.7, and joint pain.  Patient reports that the bite occurred 4 days ago and the remainder of her symptoms started yesterday.  Her symptoms also included chills.  She denies any other upper respiratory, lower respiratory, or GI symptoms.  Physical exam reveals a benign upper respiratory tree as well as benign cardiopulmonary exam.  There is an area of erythema on the left side of the rib cage without induration or fluctuance.  When viewed under magnification you can see a central bite of some kind.  Patient states that she does not go outside but she does have an animal that sleeps with her.  Given patient's fever, joint pain, headache, as well as her history of Lyme disease, we will treat her for possible tickborne illness with doxycycline twice daily for 14 days.   Final Clinical Impressions(s) / UC Diagnoses   Final diagnoses:  Rash and nonspecific skin eruption  Insect bite of left front wall of thorax, initial encounter  Arthralgia, unspecified joint     Discharge Instructions     Take the doxycycline twice daily with food for 14 days for treatment of your presumed tick bite.  Be sure to wear sunscreen when you are outdoors and reapply every 2 hours to prevent sunburn as the doxycycline will make you more prone to sunburn.  Return for reevaluation, or see your primary care provider, for any new or worsening symptoms.    ED Prescriptions    Medication Sig Dispense Auth. Provider   doxycycline (VIBRAMYCIN) 100 MG capsule Take 1 capsule (100 mg total) by mouth 2 (two) times daily for 14 days. 28 capsule Margarette Canada, NP     PDMP not reviewed this encounter.   Margarette Canada, NP 06/06/20 1255

## 2020-06-06 NOTE — Discharge Instructions (Addendum)
Take the doxycycline twice daily with food for 14 days for treatment of your presumed tick bite.  Be sure to wear sunscreen when you are outdoors and reapply every 2 hours to prevent sunburn as the doxycycline will make you more prone to sunburn.  Return for reevaluation, or see your primary care provider, for any new or worsening symptoms.

## 2020-06-06 NOTE — ED Triage Notes (Signed)
Patient c/o nasal congestion, fever and body aches that started Sunday. She also reports a possible bite to the left side of her rib cage area. She took an at home COVID test last night that was negative.

## 2020-08-22 ENCOUNTER — Encounter: Payer: Self-pay | Admitting: Obstetrics & Gynecology

## 2020-08-22 ENCOUNTER — Other Ambulatory Visit: Payer: Self-pay

## 2020-08-22 ENCOUNTER — Ambulatory Visit (INDEPENDENT_AMBULATORY_CARE_PROVIDER_SITE_OTHER): Payer: BC Managed Care – PPO | Admitting: Obstetrics & Gynecology

## 2020-08-22 VITALS — BP 100/70 | Ht 60.0 in | Wt 125.0 lb

## 2020-08-22 DIAGNOSIS — Z1231 Encounter for screening mammogram for malignant neoplasm of breast: Secondary | ICD-10-CM

## 2020-08-22 DIAGNOSIS — Z01419 Encounter for gynecological examination (general) (routine) without abnormal findings: Secondary | ICD-10-CM

## 2020-08-22 NOTE — Progress Notes (Signed)
HPI:      [name] is a 45 y.o. [phone] who LMP was No LMP recorded. (Menstrual status: IUD)., she presents today for her annual examination. The patient has no complaints today. The patient is sexually active. Her last pap: approximate date 2020 and was normal and last mammogram: approximate date 2021 and was normal. The patient does perform self breast exams.  There is no notable family history of breast or ovarian cancer in her family.  The patient has regular exercise: yes.  The patient denies current symptoms of depression.    GYN History: Contraception: vasec; also has IUD yr 2 for period control  PMHx: Past Medical History:  Diagnosis Date   Family history of ovarian cancer    cancer genetic testing letter sent 1/18, 6/20   Lyme disease    Ulcerative colitis ([place])    Past Surgical History:  Procedure Laterality Date   BREAST CYST ASPIRATION  1999   neg   Family History  Problem Relation Age of Onset   Breast cancer Paternal Grandmother 38   Ovarian cancer Maternal Grandmother 49   Hypertension Mother    Other Mother        BRCA neg   Hypertension Father    Stroke Father    Atrial fibrillation Father    Pancreatic cancer Maternal Aunt 60   Social History   Tobacco Use   Smoking status: Never   Smokeless tobacco: Never  Vaping Use   Vaping Use: Never used  Substance Use Topics   Alcohol use: Yes    Comment: occasionally   Drug use: No    Current Outpatient Medications:    levonorgestrel (MIRENA) 20 MCG/24HR IUD, by Intrauterine route., Disp: , Rfl:    mesalamine (LIALDA) 1.2 g EC tablet, Take by mouth daily with breakfast., Disp: , Rfl:    PARoxetine (PAXIL) 30 MG tablet, Take 30 mg by mouth daily., Disp: , Rfl:  Allergies: Cefdinir, Metronidazole, Sulfa antibiotics, and Sulfasalazine  Review of Systems  Constitutional:  Negative for chills, fever and malaise/fatigue.  HENT:  Negative for congestion, sinus pain and sore throat.   Eyes:   Negative for blurred vision and pain.  Respiratory:  Negative for cough and wheezing.   Cardiovascular:  Negative for chest pain and leg swelling.  Gastrointestinal:  Negative for abdominal pain, constipation, diarrhea, heartburn, nausea and vomiting.  Genitourinary:  Negative for dysuria, frequency, hematuria and urgency.  Musculoskeletal:  Positive for joint pain. Negative for back pain, myalgias and neck pain.  Skin:  Positive for itching. Negative for rash.  Neurological:  Negative for dizziness, tremors and weakness.  Endo/Heme/Allergies:  Does not bruise/bleed easily.  Psychiatric/Behavioral:  Negative for depression. The patient is not nervous/anxious and does not have insomnia.    Objective: BP 100/70   Ht 5' (1.524 m)   Wt 125 lb (56.7 kg)   BMI 24.41 kg/m   Filed Weights   08/22/20 1358  Weight: 125 lb (56.7 kg)   Body mass index is 24.41 kg/m. Physical Exam Constitutional:      General: She is not in acute distress.    Appearance: She is well-developed.  Genitourinary:     Bladder, rectum and urethral meatus normal.     No lesions in the vagina.     Right Labia: No rash, tenderness or lesions.    Left Labia: No tenderness, lesions or rash.    No vaginal bleeding.      Right Adnexa: not tender  and no mass present.    Left Adnexa: not tender and no mass present.    No cervical motion tenderness, friability, lesion or polyp.     IUD strings visualized.     Uterus is not enlarged.     No uterine mass detected.    Pelvic exam was performed with patient in the lithotomy position.  Breasts:    Right: No mass, skin change or tenderness.     Left: No mass, skin change or tenderness.  HENT:     Head: Normocephalic and atraumatic. No laceration.     Right Ear: Hearing normal.     Left Ear: Hearing normal.     Mouth/Throat:     Pharynx: Uvula midline.  Eyes:     Pupils: Pupils are equal, round, and reactive to light.  Neck:     Thyroid: No thyromegaly.   Cardiovascular:     Rate and Rhythm: Normal rate and regular rhythm.     Heart sounds: No murmur heard.   No friction rub. No gallop.  Pulmonary:     Effort: Pulmonary effort is normal. No respiratory distress.     Breath sounds: Normal breath sounds. No wheezing.  Abdominal:     General: Bowel sounds are normal. There is no distension.     Palpations: Abdomen is soft.     Tenderness: There is no abdominal tenderness. There is no rebound.  Musculoskeletal:        General: Normal range of motion.     Cervical back: Normal range of motion and neck supple.  Neurological:     Mental Status: She is alert and oriented to person, place, and time.     Cranial Nerves: No cranial nerve deficit.  Skin:    General: Skin is warm and dry.  Psychiatric:        Judgment: Judgment normal.  Vitals reviewed.    Assessment:  ANNUAL EXAM 1. Women's annual routine gynecological examination   2. Encounter for screening mammogram for malignant neoplasm of breast      Screening Plan:            1.  Cervical Screening-  Pap smear schedule reviewed with patient, Pap smear to be scheduled  2. Breast screening- Exam annually and mammogram>40 planned   3. Colonoscopy every 10 years, Hemoccult testing - after age 60  4. Labs managed by PCP  5. Counseling for contraception: vasectomy   6. Monitor night sweats.  IUD should help.  [place] discussed.    7. Discussed joint pains and allergy sx's.    F/U  Return in about 1 year (around 08/22/2021) for Annual.  Barnett Applebaum, MD, [facility], [facility] 08/22/2020  2:08 PM

## 2020-08-22 NOTE — Patient Instructions (Addendum)
PAP every three years    Due 2023 Mammogram every year    Call [phone] to schedule at [facility] Colonoscopy every 5 years     (sometime in next few years) Labs yearly (with PCP)  Thank you for choosing [facility]. As part of our ongoing efforts to improve patient experience, we would appreciate your feedback. Please fill out the short survey that you will receive by mail or MyChart. Your opinion is important to [place]! - Dr. Tiburcio Pea

## 2020-09-15 ENCOUNTER — Inpatient Hospital Stay: Admission: RE | Admit: 2020-09-15 | Payer: BC Managed Care – PPO | Source: Ambulatory Visit

## 2020-09-22 ENCOUNTER — Ambulatory Visit: Payer: BC Managed Care – PPO

## 2020-09-29 ENCOUNTER — Other Ambulatory Visit: Payer: Self-pay

## 2020-09-29 ENCOUNTER — Ambulatory Visit
Admission: RE | Admit: 2020-09-29 | Discharge: 2020-09-29 | Disposition: A | Discharge status: Home / Self Care | Payer: BC Managed Care – PPO | Source: Ambulatory Visit | Attending: Obstetrics & Gynecology | Admitting: Obstetrics & Gynecology

## 2020-09-29 DIAGNOSIS — Z1231 Encounter for screening mammogram for malignant neoplasm of breast: Secondary | ICD-10-CM | POA: Insufficient documentation

## 2020-12-26 NOTE — Telephone Encounter (Signed)
Mirena rcvd/charged 07/07/2018

## 2021-09-14 ENCOUNTER — Other Ambulatory Visit: Payer: Self-pay

## 2021-09-14 DIAGNOSIS — Z1231 Encounter for screening mammogram for malignant neoplasm of breast: Secondary | ICD-10-CM

## 2021-11-14 ENCOUNTER — Encounter: Payer: Self-pay | Admitting: Emergency Medicine

## 2021-11-14 ENCOUNTER — Ambulatory Visit
Admission: EM | Admit: 2021-11-14 | Discharge: 2021-11-14 | Disposition: A | Discharge status: Home / Self Care | Payer: BC Managed Care – PPO | Attending: Family Medicine | Admitting: Family Medicine

## 2021-11-14 DIAGNOSIS — J988 Other specified respiratory disorders: Secondary | ICD-10-CM

## 2021-11-14 DIAGNOSIS — B9789 Other viral agents as the cause of diseases classified elsewhere: Secondary | ICD-10-CM | POA: Diagnosis not present

## 2021-11-14 DIAGNOSIS — M791 Myalgia, unspecified site: Secondary | ICD-10-CM | POA: Diagnosis not present

## 2021-11-14 DIAGNOSIS — J029 Acute pharyngitis, unspecified: Secondary | ICD-10-CM | POA: Insufficient documentation

## 2021-11-14 DIAGNOSIS — R109 Unspecified abdominal pain: Secondary | ICD-10-CM | POA: Insufficient documentation

## 2021-11-14 DIAGNOSIS — Z1152 Encounter for screening for COVID-19: Secondary | ICD-10-CM | POA: Diagnosis not present

## 2021-11-14 LAB — RESP PANEL BY RT-PCR (FLU A&B, COVID) ARPGX2
Influenza A by PCR: NEGATIVE
Influenza B by PCR: NEGATIVE
SARS Coronavirus 2 by RT PCR: NEGATIVE

## 2021-11-14 LAB — GROUP A STREP BY PCR: Group A Strep by PCR: NOT DETECTED

## 2021-11-14 NOTE — ED Provider Notes (Signed)
[facility]    CSN: [id number] Arrival date & time: 11/14/21  1046      History   Chief Complaint Chief Complaint  Patient presents with   Chills   Generalized Body Aches   Abdominal Pain   Sore Throat    HPI Janet Poole is a 46 y.o. female.   HPI   Janet Poole presents for ***.   Fever : no  Chills: no Sore throat: no   Cough: no Sputum: no Nasal congestion : no  Rhinorrhea: no Myalgias: no Appetite: normal  Hydration: normal  Abdominal pain: no Nausea: no Vomiting: no Diarrhea: No Rash: No Sleep disturbance: no Headache: no      Past Medical History:  Diagnosis Date   Family history of ovarian cancer    cancer genetic testing letter sent 1/18, 6/20   Lyme disease    Ulcerative colitis [facility])     Patient Active Problem List   Diagnosis Date Noted   Night sweats 07/07/2018    Past Surgical History:  Procedure Laterality Date   BREAST CYST ASPIRATION  1999   neg    OB History     Gravida  2   Para  2   Term  2   Preterm      AB      Living  2      SAB      IAB      Ectopic      Multiple      Live Births               Home Medications    Prior to Admission medications   Medication Sig Start Date End Date Taking? Authorizing Provider  levonorgestrel (MIRENA) 20 MCG/24HR IUD by Intrauterine route.   Yes [provider]  mesalamine (LIALDA) 1.2 g EC tablet Take by mouth daily with breakfast.   Yes [provider]  PARoxetine (PAXIL) 30 MG tablet Take 30 mg by mouth daily.   Yes [provider]    Family History Family History  Problem Relation Age of Onset   Breast cancer Paternal Grandmother 76   Ovarian cancer Maternal Grandmother 47   Hypertension Mother    Other Mother        BRCA neg   Hypertension Father    Stroke Father    Atrial fibrillation Father    Pancreatic cancer Maternal Aunt 60    Social History Social History   Tobacco Use   Smoking status:  Never   Smokeless tobacco: Never  Vaping Use   Vaping Use: Never used  Substance Use Topics   Alcohol use: Yes    Comment: occasionally   Drug use: No     Allergies   Cefdinir, Metronidazole, Sulfa antibiotics, and Sulfasalazine   Review of Systems Review of Systems: negative unless otherwise stated in HPI.      Physical Exam Triage Vital Signs ED Triage Vitals  Enc Vitals Group     BP 11/14/21 1204 135/84     Pulse Rate 11/14/21 1204 81     Resp 11/14/21 1204 16     Temp 11/14/21 1204 99.1 F (37.3 C)     Temp Source 11/14/21 1204 Oral     SpO2 11/14/21 1204 97 %     Weight --      Height --      Head Circumference --      Peak Flow --      Pain Score  11/14/21 1203 3     Pain Loc --      Pain Edu? --      Excl. in [place]? --    No data found.  Updated Vital Signs BP 135/84 (BP Location: Left Arm)   Pulse 81   Temp 99.1 F (37.3 C) (Oral)   Resp 16   LMP 11/04/2021   SpO2 97%   Visual Acuity Right Eye Distance:   Left Eye Distance:   Bilateral Distance:    Right Eye Near:   Left Eye Near:    Bilateral Near:     Physical Exam GEN:     alert, non-toxic appearing female in no distress ***   HENT:  mucus membranes moist, oropharyngeal ***without lesions or ***exudate, no*** tonsillar hypertrophy, *** mild oropharyngeal erythema , *** moderate erythematous edematous turbinates, ***clear nasal discharge, ***bilateral TM ***normal EYES:   pupils equal and reactive, EOMi***, ***no scleral injection NECK:  normal ROM, no ***lymphadenopathy, ***no meningismus   RESP:  no increased work of breathing, ***clear to auscultation bilaterally CVS:   regular rate ***and rhythm Skin:   warm and dry, no rash on visible skin***, normal*** skin turgor    UC Treatments / Results  Labs (all labs ordered are listed, but only abnormal results are displayed) Labs Reviewed  GROUP A STREP BY PCR    EKG   Radiology No results found.  Procedures Procedures (including  critical care time)  Medications Ordered in UC Medications - No data to display  Initial Impression / Assessment and Plan / UC Course  I have reviewed the triage vital signs and the nursing notes.  Pertinent labs & imaging results that were available during my care of the patient were reviewed by me and considered in my medical decision making (see chart for details).       Pt is a 46 y.o. female who presents for *** days of respiratory symptoms. Elaijah is ***afebrile here without recent antipyretics. Satting well on room air. Overall pt is ***well appearing, well hydrated, without respiratory distress. Pulmonary exam ***is unremarkable.  COVID ***and influenza testing obtained. ***Pt to quarantine until COVID test results or longer if positive.  I will call patient with test results, if positive. History consistent with ***viral respiratory illness. Discussed symptomatic treatment.  Explained lack of efficacy of antibiotics in viral disease.  Typical duration of symptoms discussed. ***Return and ED precautions given and patient/parent*** voiced understanding. - continue age-appropriate Tylenol/ ***Motrin as needed for discomfort/fever - nasal saline to help with his nasal congestion - Use a mist humidifier to help with breathing - Stressed importance of hydration  - Refilled ***allergy medication/ Flonase - Albuterol to help with chest tightness ***  - Zofran for nausea *** - Work/***School note provided, per pt request  - Discussed return and ED precautions, understanding voiced.   Discussed MDM, treatment plan and plan for follow-up with patient/parent*** who agrees with plan.     Final Clinical Impressions(s) / UC Diagnoses   Final diagnoses:  None   Discharge Instructions   None    ED Prescriptions   None    PDMP not reviewed this encounter.

## 2021-11-14 NOTE — Discharge Instructions (Addendum)
Your strep test was negative. I will contact you if your COVID test is positive.  Please quarantine while you wait for the results.  If your test is negative you may resume normal activities.  If your test is positive please continue to quarantine for at least 5 days from your symptom onset or until you are without a fever for at least 24 hours after the medications.   You can take Tylenol and/or Ibuprofen as needed for fever reduction and pain relief.    For cough: honey 1/2 to 1 teaspoon (you can dilute the honey in water or another fluid).  You can also use guaifenesin and dextromethorphan for cough. You can use a humidifier for chest congestion and cough.  If you don't have a humidifier, you can sit in the bathroom with the hot shower running.      For sore throat: try warm salt water gargles, cepacol lozenges, throat spray, warm tea or water with lemon/honey, popsicles or ice, or OTC cold relief medicine for throat discomfort.    For congestion: take a daily anti-histamine like Zyrtec, Claritin, and a oral decongestant, such as pseudoephedrine.  You can also use Flonase 1-2 sprays in each nostril daily.    It is important to stay hydrated: drink plenty of fluids (water, gatorade/powerade/pedialyte, juices, or teas) to keep your throat moisturized and help further relieve irritation/discomfort.    Return or go to the Emergency Department if symptoms worsen or do not improve in the next few days

## 2021-11-14 NOTE — ED Triage Notes (Signed)
Pt presents with chills, bodyaches, abdominal pain and ST since yesterday. Pt has been exposed to strep.

## 2021-12-05 ENCOUNTER — Ambulatory Visit
Admission: RE | Admit: 2021-12-05 | Discharge: 2021-12-05 | Disposition: A | Discharge status: Home / Self Care | Payer: BC Managed Care – PPO | Source: Ambulatory Visit

## 2021-12-05 DIAGNOSIS — Z1231 Encounter for screening mammogram for malignant neoplasm of breast: Secondary | ICD-10-CM | POA: Diagnosis present

## 2023-02-28 ENCOUNTER — Other Ambulatory Visit: Payer: Self-pay

## 2023-02-28 DIAGNOSIS — Z1231 Encounter for screening mammogram for malignant neoplasm of breast: Secondary | ICD-10-CM

## 2023-03-28 ENCOUNTER — Ambulatory Visit
Admission: RE | Admit: 2023-03-28 | Discharge: 2023-03-28 | Disposition: A | Discharge status: Home / Self Care | Source: Ambulatory Visit

## 2023-03-28 DIAGNOSIS — Z1231 Encounter for screening mammogram for malignant neoplasm of breast: Secondary | ICD-10-CM | POA: Insufficient documentation
